# Patient Record
Sex: Male | Born: 1995
Health system: Southern US, Community
[De-identification: ages and names within clinical notes are randomized; demographics above are authoritative.]

## PROBLEM LIST (undated history)

## (undated) DIAGNOSIS — F329 Major depressive disorder, single episode, unspecified: Secondary | ICD-10-CM

## (undated) DIAGNOSIS — F419 Anxiety disorder, unspecified: Secondary | ICD-10-CM

## (undated) DIAGNOSIS — G44229 Chronic tension-type headache, not intractable: Secondary | ICD-10-CM

## (undated) DIAGNOSIS — I10 Essential (primary) hypertension: Secondary | ICD-10-CM

## (undated) DIAGNOSIS — F32A Depression, unspecified: Secondary | ICD-10-CM

## (undated) DIAGNOSIS — K219 Gastro-esophageal reflux disease without esophagitis: Secondary | ICD-10-CM

## (undated) HISTORY — DX: Depression, unspecified: F32.A

## (undated) HISTORY — DX: Anxiety disorder, unspecified: F41.9

## (undated) HISTORY — DX: Essential (primary) hypertension: I10

## (undated) HISTORY — PX: WISDOM TOOTH EXTRACTION: SHX21

## (undated) HISTORY — DX: Chronic tension-type headache, not intractable: G44.229

## (undated) HISTORY — DX: Gastro-esophageal reflux disease without esophagitis: K21.9

---
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
## 2019-06-19 DIAGNOSIS — Z111 Encounter for screening for respiratory tuberculosis: Secondary | ICD-10-CM | POA: Diagnosis not present

## 2019-06-21 DIAGNOSIS — Z111 Encounter for screening for respiratory tuberculosis: Secondary | ICD-10-CM | POA: Diagnosis not present

## 2019-06-28 DIAGNOSIS — Z111 Encounter for screening for respiratory tuberculosis: Secondary | ICD-10-CM | POA: Diagnosis not present

## 2019-07-01 ENCOUNTER — Ambulatory Visit (LOCAL_COMMUNITY_HEALTH_CENTER): Payer: PRIVATE HEALTH INSURANCE

## 2019-07-01 ENCOUNTER — Other Ambulatory Visit: Payer: Self-pay

## 2019-07-01 DIAGNOSIS — Z111 Encounter for screening for respiratory tuberculosis: Secondary | ICD-10-CM

## 2019-07-01 NOTE — Progress Notes (Signed)
Patient had PPD placed at [facility] on 06/28/19. Location right arm. Patient referred to [facility] Dept 07/01/19 for reading PPD Patient has some redness but no induration.  PPD reading negative- 67mm. Aileen Fass, RN

## 2019-10-23 DIAGNOSIS — F411 Generalized anxiety disorder: Secondary | ICD-10-CM | POA: Diagnosis not present

## 2019-10-24 DIAGNOSIS — Z20828 Contact with and (suspected) exposure to other viral communicable diseases: Secondary | ICD-10-CM | POA: Diagnosis not present

## 2019-10-24 DIAGNOSIS — Z03818 Encounter for observation for suspected exposure to other biological agents ruled out: Secondary | ICD-10-CM | POA: Diagnosis not present

## 2019-10-24 DIAGNOSIS — R195 Other fecal abnormalities: Secondary | ICD-10-CM | POA: Diagnosis not present

## 2019-10-24 DIAGNOSIS — R1013 Epigastric pain: Secondary | ICD-10-CM | POA: Diagnosis not present

## 2019-10-24 DIAGNOSIS — R110 Nausea: Secondary | ICD-10-CM | POA: Diagnosis not present

## 2019-10-27 DIAGNOSIS — F411 Generalized anxiety disorder: Secondary | ICD-10-CM | POA: Diagnosis not present

## 2019-10-31 ENCOUNTER — Telehealth: Payer: Self-pay

## 2019-10-31 NOTE — Telephone Encounter (Signed)
Called confirmed appiontment with patient. klh

## 2019-11-03 DIAGNOSIS — F411 Generalized anxiety disorder: Secondary | ICD-10-CM | POA: Diagnosis not present

## 2019-11-04 ENCOUNTER — Other Ambulatory Visit: Payer: Self-pay

## 2019-11-04 ENCOUNTER — Encounter (INDEPENDENT_AMBULATORY_CARE_PROVIDER_SITE_OTHER): Payer: Self-pay

## 2019-11-04 ENCOUNTER — Ambulatory Visit: Payer: BC Managed Care – PPO | Admitting: Adult Health

## 2019-11-04 ENCOUNTER — Encounter: Payer: Self-pay | Admitting: Adult Health

## 2019-11-04 VITALS — BP 141/97 | HR 100 | Resp 16 | Ht 71.0 in | Wt 245.0 lb

## 2019-11-04 DIAGNOSIS — Z6834 Body mass index (BMI) 34.0-34.9, adult: Secondary | ICD-10-CM

## 2019-11-04 DIAGNOSIS — R030 Elevated blood-pressure reading, without diagnosis of hypertension: Secondary | ICD-10-CM | POA: Diagnosis not present

## 2019-11-04 DIAGNOSIS — F419 Anxiety disorder, unspecified: Secondary | ICD-10-CM

## 2019-11-04 DIAGNOSIS — K219 Gastro-esophageal reflux disease without esophagitis: Secondary | ICD-10-CM

## 2019-11-04 NOTE — Progress Notes (Signed)
[facility] [place], [place] [address]  Internal MEDICINE  Office Visit Note  Patient Name: Leroy Duran  [id number]  [id number]  Date of Service: 11/16/2019   Complaints/HPI Pt is here for establishment of PCP. Chief Complaint  Patient presents with  . Medical Management of Chronic Issues    New patient establish care ,   . Gastroesophageal Reflux  . Anxiety    discuss anxiety   . Depression    pt does have a therapist  . Hypertension    been noticing bp has been elevated some    HPI  Pt is here to establish care. He is a well appearing 23 yo male.  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  He reports he has had 3 panic attacks in 6 months. He describes these episodes as Chest pressure, sob, tearful, overwhelming fear, and dread.  He currently sees a counselor weekly for his anxiety.  He currently takes vistaril as needed, and uses prilosec for GERD. He noticed recently his blood pressure has been borderline at home in the 140's/90's.  HE Denies palpitations, headache, or blurred vision.      Current Medication: Outpatient Encounter Medications as of 11/04/2019  Medication Sig  . hydrOXYzine (VISTARIL) 25 MG capsule Take 25 mg by mouth 3 (three) times daily as needed.  Marland Kitchen omeprazole (PRILOSEC) 20 MG capsule Take by mouth.   No facility-administered encounter medications on file as of 11/04/2019.     Surgical History: Past Surgical History:  Procedure Laterality Date  . WISDOM TOOTH EXTRACTION      Medical History: Past Medical History:  Diagnosis Date  . Anxiety   . Chronic tension headaches   . Depression   . GERD (gastroesophageal reflux disease)   . Hypertension     Family History: Family History  Problem Relation Age of Onset  . Hyperlipidemia Mother   . Diabetes Father   . Hypertension Father   . Pulmonary embolism Neg Hx   .  Kidney disease Neg Hx     Social History   Socioeconomic History  . Marital status: Unknown    Spouse name: Not on file  . Number of children: Not on file  . Years of education: Not on file  . Highest education level: Not on file  Occupational History  . Not on file  Social Needs  . Financial resource strain: Not on file  . Food insecurity    Worry: Not on file    Inability: Not on file  . Transportation needs    Medical: Not on file    Non-medical: Not on file  Tobacco Use  . Smoking status: Never Smoker  . Smokeless tobacco: Never Used  Substance and Sexual Activity  . Alcohol use: Never    Frequency: Never  . Drug use: Never  . Sexual activity: Not on file  Lifestyle  . Physical activity    Days per week: Not on file    Minutes per session: Not on file  . Stress: Not on file  Relationships  . Social Herbalist on phone: Not on file    Gets together: Not on file    Attends religious service: Not on file    Active member of club or organization: Not on file    Attends meetings of clubs or organizations: Not on file  Relationship status: Not on file  . Intimate partner violence    Fear of current or ex partner: Not on file    Emotionally abused: Not on file    Physically abused: Not on file    Forced sexual activity: Not on file  Other Topics Concern  . Not on file  Social History Narrative  . Not on file     Review of Systems  Constitutional: Negative.  Negative for chills, fatigue and unexpected weight change.  HENT: Negative.  Negative for congestion, rhinorrhea, sneezing and sore throat.   Eyes: Negative for redness.  Respiratory: Negative.  Negative for cough, chest tightness and shortness of breath.   Cardiovascular: Negative.  Negative for chest pain and palpitations.  Gastrointestinal: Negative.  Negative for abdominal pain, constipation, diarrhea, nausea and vomiting.  Endocrine: Negative.   Genitourinary: Negative.  Negative for  dysuria and frequency.  Musculoskeletal: Negative.  Negative for arthralgias, back pain, joint swelling and neck pain.  Skin: Negative.  Negative for rash.  Allergic/Immunologic: Negative.   Neurological: Negative.  Negative for tremors and numbness.  Hematological: Negative for adenopathy. Does not bruise/bleed easily.  Psychiatric/Behavioral: Negative for behavioral problems, sleep disturbance and suicidal ideas. The patient is nervous/anxious.        Panic attacks, 3 in last 6 months. Getting worse.    Vital Signs: BP (!) 141/97   Pulse 100   Resp 16   Ht 5\' 11"  (1.803 m)   Wt 245 lb (111.1 kg)   SpO2 98%   BMI 34.17 kg/m    Physical Exam Vitals signs and nursing note reviewed.  Constitutional:      General: He is not in acute distress.    Appearance: He is well-developed. He is not diaphoretic.  HENT:     Head: Normocephalic and atraumatic.     Mouth/Throat:     Pharynx: No oropharyngeal exudate.  Eyes:     Pupils: Pupils are equal, round, and reactive to light.  Neck:     Musculoskeletal: Normal range of motion and neck supple.     Thyroid: No thyromegaly.     Vascular: No JVD.     Trachea: No tracheal deviation.  Cardiovascular:     Rate and Rhythm: Normal rate and regular rhythm.     Heart sounds: Normal heart sounds. No murmur. No friction rub. No gallop.   Pulmonary:     Effort: Pulmonary effort is normal. No respiratory distress.     Breath sounds: Normal breath sounds. No wheezing or rales.  Chest:     Chest wall: No tenderness.  Abdominal:     Palpations: Abdomen is soft.     Tenderness: There is no abdominal tenderness. There is no guarding.  Musculoskeletal: Normal range of motion.  Lymphadenopathy:     Cervical: No cervical adenopathy.  Skin:    General: Skin is warm and dry.  Neurological:     Mental Status: He is alert and oriented to person, place, and time.     Cranial Nerves: No cranial nerve deficit.  Psychiatric:        Behavior: Behavior  normal.        Thought Content: Thought content normal.        Judgment: Judgment normal.     Assessment/Plan: 1. Anxiety Pt given lab slip to have physical labs drawn before next visit.  Will schedule physical in coming weeks.  We discussed a daily medication like lexapro would likely be beneficial for him, as his  anxiety is likely to continue to reoccur with the stress of medical school.  He will talk to his wife, and call clinic if he wishes to proceed with this.   2. Elevated BP without diagnosis of hypertension Will follow up in 2 weeks at physical.   3. Gastroesophageal reflux disease without esophagitis Continue prilosec, stable currently.  4. Body mass index (BMI) 34.0-34.9, adult Obesity Counseling: Risk Assessment: An assessment of behavioral risk factors was made today and includes lack of exercise sedentary lifestyle, lack of portion control and poor dietary habits.  Risk Modification Advice: She was counseled on portion control guidelines. Restricting daily caloric intake to. . The detrimental long term effects of obesity on her health and ongoing poor compliance was also discussed with the patient.    General Counseling: Yeudiel verbalizes understanding of the findings of todays visit and agrees with plan of treatment. I have discussed any further diagnostic evaluation that may be needed or ordered today. We also reviewed his medications today. he has been encouraged to call the office with any questions or concerns that should arise related to todays visit.  No orders of the defined types were placed in this encounter.   No orders of the defined types were placed in this encounter.   Time spent: 30 Minutes   This patient was seen by Orson Gear AGNP-C in Collaboration with Dr Lavera Guise as a part of collaborative care agreement  Kendell Bane AGNP-C Internal Medicine

## 2019-11-10 ENCOUNTER — Other Ambulatory Visit: Payer: Self-pay | Admitting: Internal Medicine

## 2019-11-10 ENCOUNTER — Telehealth: Payer: Self-pay

## 2019-11-10 DIAGNOSIS — F411 Generalized anxiety disorder: Secondary | ICD-10-CM | POA: Diagnosis not present

## 2019-11-10 MED ORDER — ESCITALOPRAM OXALATE 10 MG PO TABS: ORAL_TABLET | ORAL | 1 refills | Status: DC

## 2019-11-10 NOTE — Telephone Encounter (Signed)
I sent, pt should be seen in 4 weeks

## 2019-11-10 NOTE — Progress Notes (Signed)
lexapto

## 2019-11-10 NOTE — Telephone Encounter (Signed)
Pt advised we send lexapro 10 mg k take half tab po for 6 days and then increase 1 tab daily with supper and need to been seen in 4 weeks pt already had appt with adam

## 2019-11-13 DIAGNOSIS — Z79899 Other long term (current) drug therapy: Secondary | ICD-10-CM | POA: Diagnosis not present

## 2019-11-13 DIAGNOSIS — R000 Tachycardia, unspecified: Secondary | ICD-10-CM | POA: Diagnosis not present

## 2019-11-13 DIAGNOSIS — R0602 Shortness of breath: Secondary | ICD-10-CM | POA: Diagnosis not present

## 2019-11-13 DIAGNOSIS — R1013 Epigastric pain: Secondary | ICD-10-CM | POA: Diagnosis not present

## 2019-11-13 DIAGNOSIS — R002 Palpitations: Secondary | ICD-10-CM | POA: Diagnosis not present

## 2019-11-13 DIAGNOSIS — F419 Anxiety disorder, unspecified: Secondary | ICD-10-CM | POA: Diagnosis not present

## 2019-11-13 DIAGNOSIS — K219 Gastro-esophageal reflux disease without esophagitis: Secondary | ICD-10-CM | POA: Diagnosis not present

## 2019-11-13 DIAGNOSIS — R079 Chest pain, unspecified: Secondary | ICD-10-CM | POA: Diagnosis not present

## 2019-11-13 DIAGNOSIS — F329 Major depressive disorder, single episode, unspecified: Secondary | ICD-10-CM | POA: Diagnosis not present

## 2019-11-13 DIAGNOSIS — R0789 Other chest pain: Secondary | ICD-10-CM | POA: Diagnosis not present

## 2019-11-17 DIAGNOSIS — F411 Generalized anxiety disorder: Secondary | ICD-10-CM | POA: Diagnosis not present

## 2019-11-19 ENCOUNTER — Encounter: Payer: BC Managed Care – PPO | Admitting: Adult Health

## 2019-11-24 ENCOUNTER — Telehealth: Payer: Self-pay

## 2019-11-24 NOTE — Telephone Encounter (Signed)
Cancelled appointment on 11/26/2019 patient will callback to reschedule stated having insurance issues. klh

## 2019-11-26 ENCOUNTER — Encounter: Payer: BC Managed Care – PPO | Admitting: Adult Health

## 2019-12-17 ENCOUNTER — Other Ambulatory Visit: Payer: Self-pay | Admitting: Adult Health

## 2019-12-17 DIAGNOSIS — D510 Vitamin B12 deficiency anemia due to intrinsic factor deficiency: Secondary | ICD-10-CM | POA: Diagnosis not present

## 2019-12-17 DIAGNOSIS — Z0001 Encounter for general adult medical examination with abnormal findings: Secondary | ICD-10-CM | POA: Diagnosis not present

## 2019-12-17 DIAGNOSIS — E559 Vitamin D deficiency, unspecified: Secondary | ICD-10-CM | POA: Diagnosis not present

## 2019-12-18 ENCOUNTER — Telehealth: Payer: Self-pay

## 2019-12-18 ENCOUNTER — Other Ambulatory Visit: Payer: Self-pay | Admitting: Adult Health

## 2019-12-18 DIAGNOSIS — E559 Vitamin D deficiency, unspecified: Secondary | ICD-10-CM

## 2019-12-18 LAB — CBC WITH DIFFERENTIAL/PLATELET
Basophils Absolute: 0 10*3/uL (ref 0.0–0.2)
Basos: 1 %
EOS (ABSOLUTE): 0.1 10*3/uL (ref 0.0–0.4)
Eos: 1 %
Hematocrit: 48.6 % (ref 37.5–51.0)
Hemoglobin: 16.7 g/dL (ref 13.0–17.7)
Immature Grans (Abs): 0 10*3/uL (ref 0.0–0.1)
Immature Granulocytes: 0 %
Lymphocytes Absolute: 2.4 10*3/uL (ref 0.7–3.1)
Lymphs: 30 %
MCH: 29.8 pg (ref 26.6–33.0)
MCHC: 34.4 g/dL (ref 31.5–35.7)
MCV: 87 fL (ref 79–97)
Monocytes Absolute: 0.6 10*3/uL (ref 0.1–0.9)
Monocytes: 7 %
Neutrophils Absolute: 4.9 10*3/uL (ref 1.4–7.0)
Neutrophils: 61 %
Platelets: 243 10*3/uL (ref 150–450)
RBC: 5.6 x10E6/uL (ref 4.14–5.80)
RDW: 13.1 % (ref 11.6–15.4)
WBC: 8 10*3/uL (ref 3.4–10.8)

## 2019-12-18 LAB — COMPREHENSIVE METABOLIC PANEL
ALT: 27 IU/L (ref 0–44)
AST: 17 IU/L (ref 0–40)
Albumin/Globulin Ratio: 2 (ref 1.2–2.2)
Albumin: 4.5 g/dL (ref 4.1–5.2)
Alkaline Phosphatase: 90 IU/L (ref 39–117)
BUN/Creatinine Ratio: 14 (ref 9–20)
BUN: 16 mg/dL (ref 6–20)
Bilirubin Total: 0.4 mg/dL (ref 0.0–1.2)
CO2: 22 mmol/L (ref 20–29)
Calcium: 9.5 mg/dL (ref 8.7–10.2)
Chloride: 103 mmol/L (ref 96–106)
Creatinine, Ser: 1.17 mg/dL (ref 0.76–1.27)
GFR calc Af Amer: 100 mL/min/{1.73_m2} (ref >59)
GFR calc non Af Amer: 87 mL/min/{1.73_m2} (ref >59)
Globulin, Total: 2.3 g/dL (ref 1.5–4.5)
Glucose: 93 mg/dL (ref 65–99)
Potassium: 4.1 mmol/L (ref 3.5–5.2)
Sodium: 141 mmol/L (ref 134–144)
Total Protein: 6.8 g/dL (ref 6.0–8.5)

## 2019-12-18 LAB — LIPID PANEL WITH LDL/HDL RATIO
Cholesterol, Total: 192 mg/dL (ref 100–199)
HDL: 31 mg/dL — ABNORMAL LOW (ref >39)
LDL Chol Calc (NIH): 126 mg/dL — ABNORMAL HIGH (ref 0–99)
LDL/HDL Ratio: 4.1 ratio — ABNORMAL HIGH (ref 0.0–3.6)
Triglycerides: 196 mg/dL — ABNORMAL HIGH (ref 0–149)
VLDL Cholesterol Cal: 35 mg/dL (ref 5–40)

## 2019-12-18 LAB — B12 AND FOLATE PANEL
Folate: 10.7 ng/mL (ref >3.0)
Vitamin B-12: 467 pg/mL (ref 232–1245)

## 2019-12-18 LAB — VITAMIN D 25 HYDROXY (VIT D DEFICIENCY, FRACTURES): Vit D, 25-Hydroxy: 9.1 ng/mL — ABNORMAL LOW (ref 30.0–100.0)

## 2019-12-18 LAB — IRON AND TIBC
Iron Saturation: 34 % (ref 15–55)
Iron: 99 ug/dL (ref 38–169)
Total Iron Binding Capacity: 288 ug/dL (ref 250–450)
UIBC: 189 ug/dL (ref 111–343)

## 2019-12-18 LAB — T4, FREE: Free T4: 0.97 ng/dL (ref 0.82–1.77)

## 2019-12-18 LAB — FERRITIN: Ferritin: 269 ng/mL (ref 30–400)

## 2019-12-18 LAB — TSH: TSH: 2.4 u[IU]/mL (ref 0.450–4.500)

## 2019-12-18 MED ORDER — VITAMIN D (ERGOCALCIFEROL) 1.25 MG (50000 UNIT) PO CAPS: 50000.0000 [IU] | ORAL_CAPSULE | ORAL | 0 refills | Status: DC

## 2019-12-18 NOTE — Progress Notes (Signed)
Vit d is low, sent RX for drisdol, will follow up with patient as scheduled.

## 2019-12-18 NOTE — Telephone Encounter (Signed)
Confirmed appointment with patient. klh °

## 2019-12-22 ENCOUNTER — Other Ambulatory Visit: Payer: Self-pay

## 2019-12-22 ENCOUNTER — Encounter: Payer: Self-pay | Admitting: Adult Health

## 2019-12-22 ENCOUNTER — Ambulatory Visit: Payer: BC Managed Care – PPO | Admitting: Adult Health

## 2019-12-22 ENCOUNTER — Telehealth: Payer: Self-pay

## 2019-12-22 VITALS — BP 138/78 | HR 95 | Temp 97.8°F | Resp 16 | Ht 71.0 in | Wt 242.0 lb

## 2019-12-22 DIAGNOSIS — Z0001 Encounter for general adult medical examination with abnormal findings: Secondary | ICD-10-CM

## 2019-12-22 DIAGNOSIS — R300 Dysuria: Secondary | ICD-10-CM

## 2019-12-22 DIAGNOSIS — K219 Gastro-esophageal reflux disease without esophagitis: Secondary | ICD-10-CM | POA: Diagnosis not present

## 2019-12-22 DIAGNOSIS — F419 Anxiety disorder, unspecified: Secondary | ICD-10-CM

## 2019-12-22 DIAGNOSIS — D229 Melanocytic nevi, unspecified: Secondary | ICD-10-CM

## 2019-12-22 DIAGNOSIS — E559 Vitamin D deficiency, unspecified: Secondary | ICD-10-CM

## 2019-12-22 DIAGNOSIS — F411 Generalized anxiety disorder: Secondary | ICD-10-CM | POA: Diagnosis not present

## 2019-12-22 DIAGNOSIS — Z6834 Body mass index (BMI) 34.0-34.9, adult: Secondary | ICD-10-CM | POA: Diagnosis not present

## 2019-12-22 MED ORDER — ESCITALOPRAM OXALATE 20 MG PO TABS: ORAL_TABLET | ORAL | 3 refills | Status: DC

## 2019-12-22 NOTE — Telephone Encounter (Signed)
-----   Message from Kendell Bane, NP sent at 12/18/2019  5:39 PM EST ----- Vitamin D level is very low.  Sent RX to take weekly for 8 weeks.  Will see him back as scheduled.  ----- Message ----- From: Lavone Neri Lab Results In Sent: 12/18/2019  11:53 AM EST To: Kendell Bane, NP

## 2019-12-22 NOTE — Telephone Encounter (Signed)
Pt had appt today adam discuss labs with him

## 2019-12-22 NOTE — Progress Notes (Signed)
[facility] [place], [place] [address]  Internal MEDICINE  Office Visit Note  Patient Name: Leroy Duran  [id number]  [id number]  Date of Service: 12/22/2019  Chief Complaint  Patient presents with  . Annual Exam  . Hypertension  . Depression     HPI Pt is here for routine health maintenance examination.  He is a well apearing 24 yo Male here today for physical. He had his labs drawn last week. We discussed his elevated triglycerides level. Overall he has been doing well. His bp is slightly elevated today.  He has been taking his bp at home, and generally gets 120/70's when not in the hospital.   He has been on lexapro for 6 weeks, and feels like he is doing well.  He does have some ongoing anxiousness/depression.  He does feel like the lexapro has helped, but feels like the does could be increased.  When he does have an episode of depression or anxiety he feels like it is less intense.    We did discuss his sleep at this visit.  He denies excessive daytime fatigue, or snoring at night.  He does have trouble staying asleep, and we discussed a sleep study to rule of hypoxia and apnea.  He said he would think about it and let us know at next visit.  Current Medication: Outpatient Encounter Medications as of 12/22/2019  Medication Sig  . escitalopram (LEXAPRO) 20 MG tablet Take one tab PO daily.  . hydrOXYzine (VISTARIL) 25 MG capsule Take 25 mg by mouth 3 (three) times daily as needed.  Marland Kitchen omeprazole (PRILOSEC) 20 MG capsule Take by mouth.  . Vitamin D, Ergocalciferol, (DRISDOL) 1.25 MG (50000 UT) CAPS capsule Take 1 capsule (50,000 Units total) by mouth every 7 (seven) days.  . [DISCONTINUED] escitalopram (LEXAPRO) 10 MG tablet Take half tab po qd with supper for 6 days and then increase it to one tab po qd with supper for GAD/MDD   No facility-administered encounter medications on file as of 12/22/2019.    Surgical History: Past Surgical History:  Procedure  Laterality Date  . WISDOM TOOTH EXTRACTION      Medical History: Past Medical History:  Diagnosis Date  . Anxiety   . Chronic tension headaches   . Depression   . GERD (gastroesophageal reflux disease)   . Hypertension     Family History: Family History  Problem Relation Age of Onset  . Hyperlipidemia Mother   . Diabetes Father   . Hypertension Father   . Pulmonary embolism Neg Hx   . Kidney disease Neg Hx       Review of Systems  Constitutional: Negative.  Negative for chills, fatigue and unexpected weight change.  HENT: Negative.  Negative for congestion, rhinorrhea, sneezing and sore throat.   Eyes: Negative for redness.  Respiratory: Negative.  Negative for cough, chest tightness and shortness of breath.   Cardiovascular: Negative.  Negative for chest pain and palpitations.  Gastrointestinal: Negative.  Negative for abdominal pain, constipation, diarrhea, nausea and vomiting.  Endocrine: Negative.   Genitourinary: Negative.  Negative for dysuria and frequency.  Musculoskeletal: Negative.  Negative for arthralgias, back pain, joint swelling and neck pain.  Skin: Negative.  Negative for rash.  Allergic/Immunologic: Negative.   Neurological: Negative.  Negative for tremors and numbness.  Hematological: Negative for adenopathy. Does not bruise/bleed easily.  Psychiatric/Behavioral: Negative.  Negative for behavioral problems, sleep disturbance and suicidal ideas. The patient is not nervous/anxious.  Vital Signs: BP (!) 144/90   Pulse 95   Temp 97.8 F (36.6 C)   Resp 16   Ht 5\' 11"  (1.803 m)   Wt 242 lb (109.8 kg)   SpO2 98%   BMI 33.75 kg/m    Physical Exam Vitals and nursing note reviewed.  Constitutional:      General: He is not in acute distress.    Appearance: He is well-developed. He is not diaphoretic.  HENT:     Head: Normocephalic and atraumatic.     Mouth/Throat:     Pharynx: No oropharyngeal exudate.  Eyes:     Pupils: Pupils are  equal, round, and reactive to light.  Neck:     Thyroid: No thyromegaly.     Vascular: No JVD.     Trachea: No tracheal deviation.  Cardiovascular:     Rate and Rhythm: Normal rate and regular rhythm.     Heart sounds: Normal heart sounds. No murmur. No friction rub. No gallop.   Pulmonary:     Effort: Pulmonary effort is normal. No respiratory distress.     Breath sounds: Normal breath sounds. No wheezing or rales.  Chest:     Chest wall: No tenderness.  Abdominal:     Palpations: Abdomen is soft.     Tenderness: There is no abdominal tenderness. There is no guarding.  Musculoskeletal:        General: Normal range of motion.     Cervical back: Normal range of motion and neck supple.  Lymphadenopathy:     Cervical: No cervical adenopathy.  Skin:    General: Skin is warm and dry.  Neurological:     Mental Status: He is alert and oriented to person, place, and time.     Cranial Nerves: No cranial nerve deficit.  Psychiatric:        Behavior: Behavior normal.        Thought Content: Thought content normal.        Judgment: Judgment normal.      LABS: Recent Results (from the past 2160 hour(s))  CBC with Differential/Platelet     Status: None   Collection Time: 12/17/19  9:50 AM  Result Value Ref Range   WBC 8.0 3.4 - 10.8 x10E3/uL   RBC 5.60 4.14 - 5.80 x10E6/uL   Hemoglobin 16.7 13.0 - 17.7 g/dL   Hematocrit 48.6 37.5 - 51.0 %   MCV 87 79 - 97 fL   MCH 29.8 26.6 - 33.0 pg   MCHC 34.4 31.5 - 35.7 g/dL   RDW 13.1 11.6 - 15.4 %   Platelets 243 150 - 450 x10E3/uL   Neutrophils 61 Not Estab. %   Lymphs 30 Not Estab. %   Monocytes 7 Not Estab. %   Eos 1 Not Estab. %   Basos 1 Not Estab. %   Neutrophils Absolute 4.9 1.4 - 7.0 x10E3/uL   Lymphocytes Absolute 2.4 0.7 - 3.1 x10E3/uL   Monocytes Absolute 0.6 0.1 - 0.9 x10E3/uL   EOS (ABSOLUTE) 0.1 0.0 - 0.4 x10E3/uL   Basophils Absolute 0.0 0.0 - 0.2 x10E3/uL   Immature Granulocytes 0 Not Estab. %   Immature Grans (Abs)  0.0 0.0 - 0.1 x10E3/uL  Comprehensive metabolic panel     Status: None   Collection Time: 12/17/19  9:50 AM  Result Value Ref Range   Glucose 93 65 - 99 mg/dL   BUN 16 6 - 20 mg/dL   Creatinine, Ser 1.17 0.76 - 1.27 mg/dL  GFR calc non Af Amer 87 >59 mL/min/1.73   GFR calc Af Amer 100 >59 mL/min/1.73   BUN/Creatinine Ratio 14 9 - 20   Sodium 141 134 - 144 mmol/L   Potassium 4.1 3.5 - 5.2 mmol/L   Chloride 103 96 - 106 mmol/L   CO2 22 20 - 29 mmol/L   Calcium 9.5 8.7 - 10.2 mg/dL   Total Protein 6.8 6.0 - 8.5 g/dL   Albumin 4.5 4.1 - 5.2 g/dL   Globulin, Total 2.3 1.5 - 4.5 g/dL   Albumin/Globulin Ratio 2.0 1.2 - 2.2   Bilirubin Total 0.4 0.0 - 1.2 mg/dL   Alkaline Phosphatase 90 39 - 117 IU/L   AST 17 0 - 40 IU/L   ALT 27 0 - 44 IU/L  Lipid Panel With LDL/HDL Ratio     Status: Abnormal   Collection Time: 12/17/19  9:50 AM  Result Value Ref Range   Cholesterol, Total 192 100 - 199 mg/dL   Triglycerides 196 (H) 0 - 149 mg/dL   HDL 31 (L) >39 mg/dL   VLDL Cholesterol Cal 35 5 - 40 mg/dL   LDL Chol Calc (NIH) 126 (H) 0 - 99 mg/dL   LDL/HDL Ratio 4.1 (H) 0.0 - 3.6 ratio    Comment:                                     LDL/HDL Ratio                                             Men  Women                               1/2 Avg.Risk  1.0    1.5                                   Avg.Risk  3.6    3.2                                2X Avg.Risk  6.2    5.0                                3X Avg.Risk  8.0    6.1   Iron and TIBC     Status: None   Collection Time: 12/17/19  9:50 AM  Result Value Ref Range   Total Iron Binding Capacity 288 250 - 450 ug/dL   UIBC 189 111 - 343 ug/dL   Iron 99 38 - 169 ug/dL   Iron Saturation 34 15 - 55 %  B12 and Folate Panel     Status: None   Collection Time: 12/17/19  9:50 AM  Result Value Ref Range   Vitamin B-12 467 232 - 1,245 pg/mL   Folate 10.7 >3.0 ng/mL    Comment: A serum folate concentration of less than 3.1 ng/mL is considered to  represent clinical deficiency.   T4, free     Status: None   Collection Time: 12/17/19  9:50 AM  Result  Value Ref Range   Free T4 0.97 0.82 - 1.77 ng/dL  TSH     Status: None   Collection Time: 12/17/19  9:50 AM  Result Value Ref Range   TSH 2.400 0.450 - 4.500 uIU/mL  VITAMIN D 25 Hydroxy (Vit-D Deficiency, Fractures)     Status: Abnormal   Collection Time: 12/17/19  9:50 AM  Result Value Ref Range   Vit D, 25-Hydroxy 9.1 (L) 30.0 - 100.0 ng/mL    Comment: Vitamin D deficiency has been defined by the [place] practice guideline as a level of serum 25-OH vitamin D less than 20 ng/mL (1,2). The Endocrine Society went on to further define vitamin D insufficiency as a level between 21 and 29 ng/mL (2). 1. IOM (Institute of Medicine). 2010. Dietary reference    intakes for calcium and D. [place]: The    [employer]. 2. Holick MF, Binkley [place], Bischoff-Ferrari HA, et al.    Evaluation, treatment, and prevention of vitamin D    deficiency: an Endocrine Society clinical practice    guideline. JCEM. 2011 Jul; 96(7):1911-30.   Ferritin     Status: None   Collection Time: 12/17/19  9:50 AM  Result Value Ref Range   Ferritin 269 30 - 400 ng/mL    Assessment/Plan: 1. Encounter for general adult medical examination with abnormal findings Up to date on PHM.   2. Anxiety Increase dose to 20mg  daily.  - escitalopram (LEXAPRO) 20 MG tablet; Take one tab PO daily.  Dispense: 30 tablet; Refill: 3  3. Gastroesophageal reflux disease without esophagitis Stable continue prilosec as directed.   4. Body mass index (BMI) 34.0-34.9, adult Obesity Counseling: Risk Assessment: An assessment of behavioral risk factors was made today and includes lack of exercise sedentary lifestyle, lack of portion control and poor dietary habits.  Risk Modification Advice: She was counseled on portion control guidelines. Restricting daily caloric intake to   1800. The detrimental long term effects of obesity on her health and ongoing poor compliance was also discussed with the patient.  5. Dysuria - UA/M w/rflx Culture, Routine  6. Vitamin D deficiency Take Drisodol as directed, follow up as discussed.  Recheck vit D, before next appt.   7. Nevus Referral to derm to establish care.  - Ambulatory referral to Dermatology  General Counseling: vito eberling understanding of the findings of todays visit and agrees with plan of treatment. I have discussed any further diagnostic evaluation that may be needed or ordered today. We also reviewed his medications today. he has been encouraged to call the office with any questions or concerns that should arise related to todays visit.   Orders Placed This Encounter  Procedures  . UA/M w/rflx Culture, Routine  . Vitamin D 1,25 dihydroxy  . Ambulatory referral to Dermatology    Meds ordered this encounter  Medications  . escitalopram (LEXAPRO) 20 MG tablet    Sig: Take one tab PO daily.    Dispense:  30 tablet    Refill:  3    Time spent: 30 Minutes   This patient was seen by Orson Gear AGNP-C in Collaboration with Dr Lavera Guise as a part of collaborative care agreement    Kendell Bane AGNP-C Internal Medicine

## 2019-12-23 LAB — UA/M W/RFLX CULTURE, ROUTINE
Bilirubin, UA: NEGATIVE
Glucose, UA: NEGATIVE
Ketones, UA: NEGATIVE
Leukocytes,UA: NEGATIVE
Nitrite, UA: NEGATIVE
Protein,UA: NEGATIVE
RBC, UA: NEGATIVE
Specific Gravity, UA: 1.023 (ref 1.005–1.030)
Urobilinogen, Ur: 0.2 mg/dL (ref 0.2–1.0)
pH, UA: 5 (ref 5.0–7.5)

## 2019-12-23 LAB — MICROSCOPIC EXAMINATION
Bacteria, UA: NONE SEEN
Casts: NONE SEEN /lpf
Epithelial Cells (non renal): NONE SEEN /hpf (ref 0–10)

## 2020-01-01 ENCOUNTER — Other Ambulatory Visit: Payer: Self-pay | Admitting: Internal Medicine

## 2020-01-05 DIAGNOSIS — F411 Generalized anxiety disorder: Secondary | ICD-10-CM | POA: Diagnosis not present

## 2020-01-19 DIAGNOSIS — F411 Generalized anxiety disorder: Secondary | ICD-10-CM | POA: Diagnosis not present

## 2020-02-02 DIAGNOSIS — D485 Neoplasm of uncertain behavior of skin: Secondary | ICD-10-CM | POA: Diagnosis not present

## 2020-02-02 DIAGNOSIS — D225 Melanocytic nevi of trunk: Secondary | ICD-10-CM | POA: Diagnosis not present

## 2020-02-02 DIAGNOSIS — F411 Generalized anxiety disorder: Secondary | ICD-10-CM | POA: Diagnosis not present

## 2020-02-02 DIAGNOSIS — D2261 Melanocytic nevi of right upper limb, including shoulder: Secondary | ICD-10-CM | POA: Diagnosis not present

## 2020-02-20 ENCOUNTER — Other Ambulatory Visit: Payer: Self-pay

## 2020-02-20 DIAGNOSIS — Z1159 Encounter for screening for other viral diseases: Secondary | ICD-10-CM

## 2020-02-23 ENCOUNTER — Other Ambulatory Visit: Payer: Self-pay

## 2020-02-24 LAB — HCV RNA QUANT: Hepatitis C Quantitation: NOT DETECTED IU/mL

## 2020-02-24 LAB — HEPATITIS B SURFACE ANTIGEN: Hepatitis B Surface Ag: NEGATIVE

## 2020-02-26 ENCOUNTER — Telehealth: Payer: Self-pay

## 2020-02-26 NOTE — Telephone Encounter (Signed)
Confirmed appointment on 03/01/2020 and screened for covid. klh 

## 2020-03-01 ENCOUNTER — Ambulatory Visit: Payer: 59 | Admitting: Adult Health

## 2020-03-01 ENCOUNTER — Encounter: Payer: Self-pay | Admitting: Adult Health

## 2020-03-01 ENCOUNTER — Other Ambulatory Visit: Payer: Self-pay

## 2020-03-01 VITALS — BP 133/89 | HR 78 | Temp 97.4°F | Wt 256.8 lb

## 2020-03-01 DIAGNOSIS — Z111 Encounter for screening for respiratory tuberculosis: Secondary | ICD-10-CM

## 2020-03-01 DIAGNOSIS — K219 Gastro-esophageal reflux disease without esophagitis: Secondary | ICD-10-CM | POA: Diagnosis not present

## 2020-03-01 DIAGNOSIS — Z23 Encounter for immunization: Secondary | ICD-10-CM | POA: Diagnosis not present

## 2020-03-01 DIAGNOSIS — F419 Anxiety disorder, unspecified: Secondary | ICD-10-CM

## 2020-03-01 DIAGNOSIS — Z1159 Encounter for screening for other viral diseases: Secondary | ICD-10-CM

## 2020-03-01 NOTE — Progress Notes (Signed)
[facility] [place], [place] [address]  Internal MEDICINE  Office Visit Note  Patient Name: Leroy Duran  [id number]  [id number]  Date of Service: 03/01/2020  Chief Complaint  Patient presents with  . Follow-up    medical forms for school  . Depression  . Gastroesophageal Reflux  . Hypertension    HPI  Pt is here for follow on anxiety, and GERD.  He is doing very well with lexapro and denies any issues with anxiety/depression.  His gerd is controlled with omeprazole.  He has recently been accepted into medical school, and needs a physical form completed as well as vaccination record.    Current Medication: Outpatient Encounter Medications as of 03/01/2020  Medication Sig  . escitalopram (LEXAPRO) 20 MG tablet Take one tab PO daily.  . hydrOXYzine (VISTARIL) 25 MG capsule Take 25 mg by mouth 3 (three) times daily as needed.  Marland Kitchen omeprazole (PRILOSEC) 20 MG capsule Take by mouth.  . Vitamin D, Ergocalciferol, (DRISDOL) 1.25 MG (50000 UT) CAPS capsule Take 1 capsule (50,000 Units total) by mouth every 7 (seven) days.   No facility-administered encounter medications on file as of 03/01/2020.    Surgical History: Past Surgical History:  Procedure Laterality Date  . WISDOM TOOTH EXTRACTION      Medical History: Past Medical History:  Diagnosis Date  . Anxiety   . Chronic tension headaches   . Depression   . GERD (gastroesophageal reflux disease)   . Hypertension     Family History: Family History  Problem Relation Age of Onset  . Hyperlipidemia Mother   . Diabetes Father   . Hypertension Father   . Pulmonary embolism Neg Hx   . Kidney disease Neg Hx     Social History   Socioeconomic History  . Marital status: Unknown    Spouse name: Not on file  . Number of children: Not on file  . Years of education: Not on file  . Highest education level: Not on file  Occupational History  . Not on file  Tobacco Use  . Smoking status: Never  Smoker  . Smokeless tobacco: Never Used  Substance and Sexual Activity  . Alcohol use: Never  . Drug use: Never  . Sexual activity: Not on file  Other Topics Concern  . Not on file  Social History Narrative  . Not on file   Social Determinants of Health   Financial Resource Strain:   . Difficulty of Paying Living Expenses:   Food Insecurity:   . Worried About Charity fundraiser in the Last Year:   . Arboriculturist in the Last Year:   Transportation Needs:   . Film/video editor (Medical):   Marland Kitchen Lack of Transportation (Non-Medical):   Physical Activity:   . Days of Exercise per Week:   . Minutes of Exercise per Session:   Stress:   . Feeling of Stress :   Social Connections:   . Frequency of Communication with Friends and Family:   . Frequency of Social Gatherings with Friends and Family:   . Attends Religious Services:   . Active Member of Clubs or Organizations:   . Attends Archivist Meetings:   Marland Kitchen Marital Status:   Intimate Partner Violence:   . Fear of Current or Ex-Partner:   . Emotionally Abused:   Marland Kitchen Physically Abused:   . Sexually Abused:       Review of Systems  Constitutional: Negative.  Negative for  chills, fatigue and unexpected weight change.  HENT: Negative.  Negative for congestion, rhinorrhea, sneezing and sore throat.   Eyes: Negative for redness.  Respiratory: Negative.  Negative for cough, chest tightness and shortness of breath.   Cardiovascular: Negative.  Negative for chest pain and palpitations.  Gastrointestinal: Negative.  Negative for abdominal pain, constipation, diarrhea, nausea and vomiting.  Endocrine: Negative.   Genitourinary: Negative.  Negative for dysuria and frequency.  Musculoskeletal: Negative.  Negative for arthralgias, back pain, joint swelling and neck pain.  Skin: Negative.  Negative for rash.  Allergic/Immunologic: Negative.   Neurological: Negative.  Negative for tremors and numbness.  Hematological:  Negative for adenopathy. Does not bruise/bleed easily.  Psychiatric/Behavioral: Negative.  Negative for behavioral problems, sleep disturbance and suicidal ideas. The patient is not nervous/anxious.     Vital Signs: BP 133/89   Pulse 78   Temp (!) 97.4 F (36.3 C)   Wt 256 lb 12.8 oz (116.5 kg)   SpO2 97%   BMI 35.82 kg/m    Physical Exam Vitals and nursing note reviewed.  Constitutional:      General: He is not in acute distress.    Appearance: He is well-developed. He is not diaphoretic.  HENT:     Head: Normocephalic and atraumatic.     Mouth/Throat:     Pharynx: No oropharyngeal exudate.  Eyes:     Pupils: Pupils are equal, round, and reactive to light.  Neck:     Thyroid: No thyromegaly.     Vascular: No JVD.     Trachea: No tracheal deviation.  Cardiovascular:     Rate and Rhythm: Normal rate and regular rhythm.     Heart sounds: Normal heart sounds. No murmur. No friction rub. No gallop.   Pulmonary:     Effort: Pulmonary effort is normal. No respiratory distress.     Breath sounds: Normal breath sounds. No wheezing or rales.  Chest:     Chest wall: No tenderness.  Abdominal:     Palpations: Abdomen is soft.     Tenderness: There is no abdominal tenderness. There is no guarding.  Musculoskeletal:        General: Normal range of motion.     Cervical back: Normal range of motion and neck supple.  Lymphadenopathy:     Cervical: No cervical adenopathy.  Skin:    General: Skin is warm and dry.  Neurological:     Mental Status: He is alert and oriented to person, place, and time.     Cranial Nerves: No cranial nerve deficit.  Psychiatric:        Behavior: Behavior normal.        Thought Content: Thought content normal.        Judgment: Judgment normal.    Assessment/Plan: 1. Anxiety Well controlled with lexapro.  Continue as directed.   2. Gastroesophageal reflux disease without esophagitis Controled, continue omeprazole.  3. Immunization  due Immunizations reviewed, patient is up to date.  Awaiting titers.   4. Need for hepatitis B screening test - Hepatitis B Surface AntiBODY  5. Screening-pulmonary TB - QuantiFERON-TB Gold Plus  General Counseling: Zaelyn verbalizes understanding of the findings of todays visit and agrees with plan of treatment. I have discussed any further diagnostic evaluation that may be needed or ordered today. We also reviewed his medications today. he has been encouraged to call the office with any questions or concerns that should arise related to todays visit.    Orders Placed This Encounter  Procedures  . Hepatitis B Surface AntiBODY  . QuantiFERON-TB Gold Plus    No orders of the defined types were placed in this encounter.   Time spent: 25 Minutes   This patient was seen by Orson Gear AGNP-C in Collaboration with Dr Lavera Guise as a part of collaborative care agreement     Kendell Bane AGNP-C Internal medicine

## 2020-03-05 LAB — QUANTIFERON-TB GOLD PLUS
QuantiFERON Mitogen Value: >10 IU/mL
QuantiFERON Nil Value: 0.04 IU/mL
QuantiFERON TB1 Ag Value: 1 IU/mL
QuantiFERON TB2 Ag Value: 1.24 IU/mL
QuantiFERON-TB Gold Plus: POSITIVE — AB

## 2020-03-05 LAB — HEPATITIS B SURFACE ANTIBODY,QUALITATIVE: Hep B Surface Ab, Qual: NONREACTIVE

## 2020-03-08 ENCOUNTER — Other Ambulatory Visit: Payer: Self-pay

## 2020-03-08 ENCOUNTER — Other Ambulatory Visit: Payer: Self-pay | Admitting: Adult Health

## 2020-03-08 ENCOUNTER — Ambulatory Visit
Admission: RE | Admit: 2020-03-08 | Discharge: 2020-03-08 | Disposition: A | Discharge status: Home / Self Care | Payer: PRIVATE HEALTH INSURANCE | Source: Ambulatory Visit | Attending: Adult Health | Admitting: Adult Health

## 2020-03-08 DIAGNOSIS — R7612 Nonspecific reaction to cell mediated immunity measurement of gamma interferon antigen response without active tuberculosis: Secondary | ICD-10-CM

## 2020-03-08 NOTE — Progress Notes (Signed)
Order for repeat Quantiferon-TB Gold and order for CXR due to positive Quantiferon-TB test.

## 2020-03-10 LAB — QUANTIFERON-TB GOLD PLUS
QuantiFERON Mitogen Value: >10 IU/mL
QuantiFERON Nil Value: 0.03 IU/mL
QuantiFERON TB1 Ag Value: 0.76 IU/mL
QuantiFERON TB2 Ag Value: 2.71 IU/mL
QuantiFERON-TB Gold Plus: POSITIVE — AB

## 2020-03-15 DIAGNOSIS — F411 Generalized anxiety disorder: Secondary | ICD-10-CM | POA: Diagnosis not present

## 2020-03-25 ENCOUNTER — Telehealth: Payer: Self-pay

## 2020-03-25 ENCOUNTER — Other Ambulatory Visit: Payer: Self-pay | Admitting: Family Medicine

## 2020-03-25 ENCOUNTER — Other Ambulatory Visit: Payer: Self-pay

## 2020-03-25 ENCOUNTER — Ambulatory Visit (LOCAL_COMMUNITY_HEALTH_CENTER): Payer: PRIVATE HEALTH INSURANCE

## 2020-03-25 VITALS — Wt 254.0 lb

## 2020-03-25 DIAGNOSIS — R7612 Nonspecific reaction to cell mediated immunity measurement of gamma interferon antigen response without active tuberculosis: Secondary | ICD-10-CM

## 2020-03-25 DIAGNOSIS — Z227 Latent tuberculosis: Secondary | ICD-10-CM

## 2020-03-25 NOTE — Progress Notes (Signed)
Tuberculosis treatment orders  All patients are to be monitored per [place] and county TB policies.   Leroy Duran has latent TB. Treat for latent TB per the following:  Rifampin 600mg  daily by mouth x 4 months, draw LFTs monthly.   Also draw baseline labs including HIV/RPR at LTBI tx start appt on 03/29/20.   +QFT and normal CXR 03/08/20

## 2020-03-25 NOTE — Progress Notes (Signed)
EPI completed via phone. Referred for +QFT. In the past has had questionable TB skin tests.  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 Interested in LTBI tx. Baseline labs to be drawn at Rifampin start appt. Aileen Fass, RN

## 2020-03-25 NOTE — Telephone Encounter (Signed)
Spoke with Leroy Duran from health dept that pt  Labs for tb positive she said they will call pt and schedule him for further eval and treat and also pt advised that health will call him and also his form is ready for pickup

## 2020-03-29 ENCOUNTER — Ambulatory Visit (LOCAL_COMMUNITY_HEALTH_CENTER): Payer: PRIVATE HEALTH INSURANCE

## 2020-03-29 ENCOUNTER — Other Ambulatory Visit: Payer: Self-pay

## 2020-03-29 VITALS — Wt 254.0 lb

## 2020-03-29 DIAGNOSIS — Z23 Encounter for immunization: Secondary | ICD-10-CM

## 2020-03-29 DIAGNOSIS — R7612 Nonspecific reaction to cell mediated immunity measurement of gamma interferon antigen response without active tuberculosis: Secondary | ICD-10-CM

## 2020-03-29 MED ORDER — RIFAMPIN 300 MG PO CAPS: 600.0000 mg | ORAL_CAPSULE | Freq: Every day | ORAL | 0 refills | Status: AC

## 2020-03-29 NOTE — Progress Notes (Signed)
Patient had recent non reactive Hep B titer.  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 Hep B given; tolerated well Aileen Fass, RN

## 2020-03-29 NOTE — Progress Notes (Signed)
Curator for clinical support staff: I agree with the care provided to this patient and was available for any consultation.  I was consulted and agree with the provision of LTBI treatment with rifampin 600mg  daily for 4 months.   Caren Macadam, MD, MPH, ABFM [facility] Medical Director

## 2020-03-30 ENCOUNTER — Telehealth: Payer: Self-pay

## 2020-03-30 ENCOUNTER — Ambulatory Visit (INDEPENDENT_AMBULATORY_CARE_PROVIDER_SITE_OTHER): Payer: PRIVATE HEALTH INSURANCE | Admitting: Dermatology

## 2020-03-30 DIAGNOSIS — D485 Neoplasm of uncertain behavior of skin: Secondary | ICD-10-CM

## 2020-03-30 LAB — CBC WITH DIFFERENTIAL/PLATELET
Basophils Absolute: 0 10*3/uL (ref 0.0–0.2)
Basos: 1 %
EOS (ABSOLUTE): 0.1 10*3/uL (ref 0.0–0.4)
Eos: 1 %
Hematocrit: 52 % — ABNORMAL HIGH (ref 37.5–51.0)
Hemoglobin: 17.8 g/dL — ABNORMAL HIGH (ref 13.0–17.7)
Immature Grans (Abs): 0 10*3/uL (ref 0.0–0.1)
Immature Granulocytes: 0 %
Lymphocytes Absolute: 2.1 10*3/uL (ref 0.7–3.1)
Lymphs: 33 %
MCH: 30 pg (ref 26.6–33.0)
MCHC: 34.2 g/dL (ref 31.5–35.7)
MCV: 88 fL (ref 79–97)
Monocytes Absolute: 0.4 10*3/uL (ref 0.1–0.9)
Monocytes: 6 %
Neutrophils Absolute: 3.7 10*3/uL (ref 1.4–7.0)
Neutrophils: 59 %
Platelets: 227 10*3/uL (ref 150–450)
RBC: 5.94 x10E6/uL — ABNORMAL HIGH (ref 4.14–5.80)
RDW: 12.7 % (ref 11.6–15.4)
WBC: 6.3 10*3/uL (ref 3.4–10.8)

## 2020-03-30 LAB — HEPATIC FUNCTION PANEL
ALT: 20 IU/L (ref 0–44)
AST: 18 IU/L (ref 0–40)
Albumin: 4.6 g/dL (ref 4.1–5.2)
Alkaline Phosphatase: 80 IU/L (ref 39–117)
Bilirubin Total: 0.5 mg/dL (ref 0.0–1.2)
Bilirubin, Direct: 0.12 mg/dL (ref 0.00–0.40)
Total Protein: 6.7 g/dL (ref 6.0–8.5)

## 2020-03-30 MED ORDER — MUPIROCIN 2 % EX OINT: 1.0000 "application " | TOPICAL_OINTMENT | Freq: Every day | CUTANEOUS | 0 refills | Status: AC

## 2020-03-30 NOTE — Telephone Encounter (Signed)
Left message for patient regarding today's surgery.

## 2020-03-30 NOTE — Progress Notes (Signed)
Rifampin 300mg  (#60) dispensed per [facility] order.  Baseline labs drawn today.  Employer letter and TBS form given also. Aileen Fass, RN

## 2020-03-30 NOTE — Telephone Encounter (Signed)
See EPI and Rifampin start appt Aileen Fass, RN

## 2020-03-30 NOTE — Patient Instructions (Signed)

## 2020-03-30 NOTE — Progress Notes (Signed)
   Follow-Up Visit   Subjective  Leroy Duran is a 24 y.o. 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    The following portions of the chart were reviewed this encounter and updated as appropriate: Tobacco  Allergies  Meds  Problems  Med Hx  Surg Hx  Fam Hx      Review of Systems: No other skin or systemic complaints.  Objective  Well appearing patient in no apparent distress; mood and affect are within normal limits.  A focused examination was performed including right upper arm. Relevant physical exam findings are noted in the Assessment and Plan.  Objective  Right ant deltoid - Anterior: 1.5 x 1.1 cm Healing biopsy site  Assessment & Plan  Neoplasm of uncertain behavior of skin Right ant deltoid - Anterior  mupirocin ointment (BACTROBAN) 2 %  Skin excision  Lesion length (cm):  1.5 Lesion width (cm):  1.1 Margin per side (cm):  0.2 Total excision diameter (cm):  1.9 Informed consent: discussed and consent obtained   Timeout: patient name, date of birth, surgical site, and procedure verified   Procedure prep:  Patient was prepped and draped in usual sterile fashion Prep type:  Isopropyl alcohol and povidone-iodine Anesthesia: the lesion was anesthetized in a standard fashion   Anesthetic:  1% lidocaine w/ epinephrine 1-100,000 buffered w/ 8.4% NaHCO3 (13cc) Instrument used: #15 blade   Hemostasis achieved with: pressure   Hemostasis achieved with comment:  Electrocautery Outcome: patient tolerated procedure well with no complications   Post-procedure details: sterile dressing applied and wound care instructions given   Dressing type: bandage and pressure dressing (mupirocin)    Skin repair Complexity:  Complex Final length (cm):  5 Reason for type of repair: reduce tension to allow closure, reduce the risk of dehiscence, infection, and necrosis, reduce subcutaneous dead space and avoid a  hematoma, allow closure of the large defect, preserve normal anatomy, preserve normal anatomical and functional relationships and enhance both functionality and cosmetic results   Undermining comment:  Undermining defect 2 cm Subcutaneous layers (deep stitches):  Suture size:  2-0 Suture type: Vicryl (polyglactin 910)   Subcutaneous suture technique: inverted dermal. Fine/surface layer approximation (top stitches):  Suture size:  3-0 Suture type comment:  Nylon Stitches: simple running   Suture removal (days):  7 Hemostasis achieved with: suture and pressure Outcome: patient tolerated procedure well with no complications   Post-procedure details: sterile dressing applied and wound care instructions given   Dressing type: bandage and pressure dressing (mupirocin)    Specimen 1 - Surgical pathology Differential Diagnosis: Atypical Spindle Cell Nevus Check Margins: Yes 1.5 x 1.1 cm Healing biopsy site [id number]  Return in about 1 week (around 04/06/2020).   I, Ashok Cordia, CMA, am acting as scribe for Sarina Ser, MD . Documentation: I have reviewed the above documentation for accuracy and completeness, and I agree with the above.  Sarina Ser, MD

## 2020-03-31 ENCOUNTER — Encounter: Payer: Self-pay | Admitting: Dermatology

## 2020-04-02 ENCOUNTER — Telehealth: Payer: Self-pay

## 2020-04-02 NOTE — Telephone Encounter (Signed)
Patient informed of pathology results 

## 2020-04-05 ENCOUNTER — Telehealth: Payer: Self-pay

## 2020-04-05 NOTE — Telephone Encounter (Signed)
Confirmed appointment on 04/07/2020. klh

## 2020-04-06 ENCOUNTER — Ambulatory Visit: Payer: PRIVATE HEALTH INSURANCE | Admitting: Dermatology

## 2020-04-07 ENCOUNTER — Other Ambulatory Visit: Payer: Self-pay

## 2020-04-07 ENCOUNTER — Encounter: Payer: Self-pay | Admitting: Adult Health

## 2020-04-07 ENCOUNTER — Ambulatory Visit: Payer: 59 | Admitting: Adult Health

## 2020-04-07 ENCOUNTER — Ambulatory Visit (INDEPENDENT_AMBULATORY_CARE_PROVIDER_SITE_OTHER): Payer: PRIVATE HEALTH INSURANCE | Admitting: Dermatology

## 2020-04-07 VITALS — BP 138/78 | HR 92 | Temp 97.6°F | Resp 16 | Ht 71.0 in | Wt 251.0 lb

## 2020-04-07 DIAGNOSIS — R7612 Nonspecific reaction to cell mediated immunity measurement of gamma interferon antigen response without active tuberculosis: Secondary | ICD-10-CM

## 2020-04-07 DIAGNOSIS — E559 Vitamin D deficiency, unspecified: Secondary | ICD-10-CM

## 2020-04-07 DIAGNOSIS — F411 Generalized anxiety disorder: Secondary | ICD-10-CM

## 2020-04-07 DIAGNOSIS — D225 Melanocytic nevi of trunk: Secondary | ICD-10-CM | POA: Diagnosis not present

## 2020-04-07 DIAGNOSIS — Z4802 Encounter for removal of sutures: Secondary | ICD-10-CM

## 2020-04-07 DIAGNOSIS — Z87898 Personal history of other specified conditions: Secondary | ICD-10-CM

## 2020-04-07 DIAGNOSIS — D239 Other benign neoplasm of skin, unspecified: Secondary | ICD-10-CM

## 2020-04-07 DIAGNOSIS — Z86018 Personal history of other benign neoplasm: Secondary | ICD-10-CM

## 2020-04-07 MED ORDER — HYDROXYZINE PAMOATE 25 MG PO CAPS: 25.0000 mg | ORAL_CAPSULE | Freq: Three times a day (TID) | ORAL | 1 refills | Status: DC | PRN

## 2020-04-07 MED ORDER — VITAMIN D (ERGOCALCIFEROL) 1.25 MG (50000 UNIT) PO CAPS: 50000.0000 [IU] | ORAL_CAPSULE | ORAL | 0 refills | Status: DC

## 2020-04-07 NOTE — Patient Instructions (Signed)
After Suture Removal ° °1. After sutures are removed, the wound should be coated with an antibiotic ointment (eg. Polysporin, Bacitracin) and, if possible, kept covered with a Band-Aid or bandage for an additional 24 hours.  After that, no additional wound care is generally needed.  °2. It is alright to get the area wet. °3. If a skin cancer was removed, your skin should be re-examined in approximately three months. ° ° ° °

## 2020-04-07 NOTE — Progress Notes (Signed)
[facility] [place], [place] [address]  Internal MEDICINE  Office Visit Note  Patient Name: Leroy Duran  [id number]  [id number]  Date of Service: 04/27/2020  Chief Complaint  Patient presents with  . Follow-up    having anxiety  . Depression  . Gastroesophageal Reflux  . Hypertension    HPI  Pt is here for follow up on depression, anxiety, and GERD.  He has been diagnosed with latent TB, and is being treated by the health department. He had an exacerbation of his depression and anxiety a few days after starting the Rifampin.  He contacted the Health department and they told him to stop the meds.  He is feeling somewhat better, and feels like he is headed in the right direction as far as his anxiety is going.      Current Medication: Outpatient Encounter Medications as of 04/07/2020  Medication Sig  . mupirocin ointment (BACTROBAN) 2 % Apply 1 application topically daily. With dressing changes  . omeprazole (PRILOSEC) 20 MG capsule Take by mouth.  . rifampin (RIFADIN) 300 MG capsule Take 2 capsules (600 mg total) by mouth daily.  . [DISCONTINUED] escitalopram (LEXAPRO) 20 MG tablet Take one tab PO daily.  . hydrOXYzine (VISTARIL) 25 MG capsule Take 1 capsule (25 mg total) by mouth 3 (three) times daily as needed.  . Vitamin D, Ergocalciferol, (DRISDOL) 1.25 MG (50000 UNIT) CAPS capsule Take 1 capsule (50,000 Units total) by mouth every 7 (seven) days.  . [DISCONTINUED] Vitamin D, Ergocalciferol, (DRISDOL) 1.25 MG (50000 UT) CAPS capsule Take 1 capsule (50,000 Units total) by mouth every 7 (seven) days.   No facility-administered encounter medications on file as of 04/07/2020.    Surgical History: Past Surgical History:  Procedure Laterality Date  . WISDOM TOOTH EXTRACTION      Medical History: Past Medical History:  Diagnosis Date  . Anxiety   . Chronic tension headaches   . Depression   . GERD (gastroesophageal reflux disease)   . Hypertension      Family History: Family History  Problem Relation Age of Onset  . Hyperlipidemia Mother   . Diabetes Father   . Hypertension Father   . Pulmonary embolism Neg Hx   . Kidney disease Neg Hx     Social History   Socioeconomic History  . Marital status: Unknown    Spouse name: Not on file  . Number of children: Not on file  . Years of education: Not on file  . Highest education level: Not on file  Occupational History  . Not on file  Tobacco Use  . Smoking status: Never Smoker  . Smokeless tobacco: Never Used  Substance and Sexual Activity  . Alcohol use: Never  . Drug use: Never  . Sexual activity: Yes  Other Topics Concern  . Not on file  Social History Narrative  . Not on file   Social Determinants of Health   Financial Resource Strain:   . Difficulty of Paying Living Expenses:   Food Insecurity:   . Worried About Charity fundraiser in the Last Year:   . Arboriculturist in the Last Year:   Transportation Needs:   . Film/video editor (Medical):   Marland Kitchen Lack of Transportation (Non-Medical):   Physical Activity:   . Days of Exercise per Week:   . Minutes of Exercise per Session:   Stress:   . Feeling of Stress :   Social Connections:   . Frequency  of Communication with Friends and Family:   . Frequency of Social Gatherings with Friends and Family:   . Attends Religious Services:   . Active Member of Clubs or Organizations:   . Attends Archivist Meetings:   Marland Kitchen Marital Status:   Intimate Partner Violence:   . Fear of Current or Ex-Partner:   . Emotionally Abused:   Marland Kitchen Physically Abused:   . Sexually Abused:       Review of Systems  Constitutional: Negative.  Negative for chills, fatigue and unexpected weight change.  HENT: Negative.  Negative for congestion, rhinorrhea, sneezing and sore throat.   Eyes: Negative for redness.  Respiratory: Negative.  Negative for cough, chest tightness and shortness of breath.   Cardiovascular: Negative.   Negative for chest pain and palpitations.  Gastrointestinal: Negative.  Negative for abdominal pain, constipation, diarrhea, nausea and vomiting.  Endocrine: Negative.   Genitourinary: Negative.  Negative for dysuria and frequency.  Musculoskeletal: Negative.  Negative for arthralgias, back pain, joint swelling and neck pain.  Skin: Negative.  Negative for rash.  Allergic/Immunologic: Negative.   Neurological: Negative.  Negative for tremors and numbness.  Hematological: Negative for adenopathy. Does not bruise/bleed easily.  Psychiatric/Behavioral: Negative.  Negative for behavioral problems, sleep disturbance and suicidal ideas. The patient is not nervous/anxious.     Vital Signs: BP 138/78   Pulse 92   Temp 97.6 F (36.4 C)   Resp 16   Ht 5\' 11"  (1.803 m)   Wt 251 lb (113.9 kg)   SpO2 98%   BMI 35.01 kg/m    Physical Exam Vitals and nursing note reviewed.  Constitutional:      General: He is not in acute distress.    Appearance: He is well-developed. He is not diaphoretic.  HENT:     Head: Normocephalic and atraumatic.     Mouth/Throat:     Pharynx: No oropharyngeal exudate.  Eyes:     Pupils: Pupils are equal, round, and reactive to light.  Neck:     Thyroid: No thyromegaly.     Vascular: No JVD.     Trachea: No tracheal deviation.  Cardiovascular:     Rate and Rhythm: Normal rate and regular rhythm.     Heart sounds: Normal heart sounds. No murmur. No friction rub. No gallop.   Pulmonary:     Effort: Pulmonary effort is normal. No respiratory distress.     Breath sounds: Normal breath sounds. No wheezing or rales.  Chest:     Chest wall: No tenderness.  Abdominal:     Palpations: Abdomen is soft.     Tenderness: There is no abdominal tenderness. There is no guarding.  Musculoskeletal:        General: Normal range of motion.     Cervical back: Normal range of motion and neck supple.  Lymphadenopathy:     Cervical: No cervical adenopathy.  Skin:    General:  Skin is warm and dry.  Neurological:     Mental Status: He is alert and oriented to person, place, and time.     Cranial Nerves: No cranial nerve deficit.  Psychiatric:        Behavior: Behavior normal.        Thought Content: Thought content normal.        Judgment: Judgment normal.    Assessment/Plan: 1. Vitamin D deficiency Start Vit D replacement as discussed.  - Vitamin D, Ergocalciferol, (DRISDOL) 1.25 MG (50000 UNIT) CAPS capsule; Take 1 capsule (50,000  Units total) by mouth every 7 (seven) days.  Dispense: 8 capsule; Refill: 0  2. GAD (generalized anxiety disorder) Refilled Vistaril - hydrOXYzine (VISTARIL) 25 MG capsule; Take 1 capsule (25 mg total) by mouth 3 (three) times daily as needed.  Dispense: 90 capsule; Refill: 1  3. Positive QuantiFERON-TB Gold test Continue to follow with health department for treatment and surveillance.   General Counseling: Timathy verbalizes understanding of the findings of todays visit and agrees with plan of treatment. I have discussed any further diagnostic evaluation that may be needed or ordered today. We also reviewed his medications today. he has been encouraged to call the office with any questions or concerns that should arise related to todays visit.    No orders of the defined types were placed in this encounter.   Meds ordered this encounter  Medications  . hydrOXYzine (VISTARIL) 25 MG capsule    Sig: Take 1 capsule (25 mg total) by mouth 3 (three) times daily as needed.    Dispense:  90 capsule    Refill:  1  . Vitamin D, Ergocalciferol, (DRISDOL) 1.25 MG (50000 UNIT) CAPS capsule    Sig: Take 1 capsule (50,000 Units total) by mouth every 7 (seven) days.    Dispense:  8 capsule    Refill:  0    Time spent: 30 Minutes   This patient was seen by Orson Gear AGNP-C in Collaboration with Dr Lavera Guise as a part of collaborative care agreement     Kendell Bane AGNP-C Internal medicine

## 2020-04-07 NOTE — Progress Notes (Signed)
   Follow-Up Visit   Subjective  Leroy Duran is a 24 y.o. male who presents for the following: repigmented dysplastic nevus (patient has noticed some repigmentation at the LUQA lat epigastric site) and post op./suture removal (R ant deltoid - suture removal today ).  The following portions of the chart were reviewed this encounter and updated as appropriate:  Tobacco  Allergies  Meds  Problems  Med Hx  Surg Hx  Fam Hx     Review of Systems:  No other skin or systemic complaints except as noted in HPI or Assessment and Plan.  Objective  Well appearing patient in no apparent distress; mood and affect are within normal limits.  A focused examination was performed including the trunk and extremities. Relevant physical exam findings are noted in the Assessment and Plan.  Objective  LUQA lat epigastric: Repigmented brown macule   Objective  R ant deltoid: Healing excision site    Assessment & Plan  Dysplastic nevus LUQA lat epigastric  Epidermal / dermal shaving - LUQA lat epigastric  Lesion length (cm):  0.6 Lesion width (cm):  0.3 Margin per side (cm):  0.2 Total excision diameter (cm):  1 Informed consent: discussed and consent obtained   Timeout: patient name, date of birth, surgical site, and procedure verified   Procedure prep:  Patient was prepped and draped in usual sterile fashion Prep type:  Isopropyl alcohol Anesthesia: the lesion was anesthetized in a standard fashion   Anesthetic:  1% lidocaine w/ epinephrine 1-100,000 buffered w/ 8.4% NaHCO3 Instrument used: flexible razor blade   Hemostasis achieved with: pressure, aluminum chloride and electrodesiccation   Outcome: patient tolerated procedure well   Post-procedure details: sterile dressing applied and wound care instructions given   Dressing type: bandage and petrolatum    History of atypical nevus R ant deltoid  Encounter for Removal of Sutures - Incision site at the R ant deltoid is clean, dry and  intact - Wound cleansed, sutures removed, wound cleansed and steri strips applied.  - Discussed pathology results showing margins free - Patient advised to keep steri-strips dry until they fall off. - Scars remodel for a full year. - Once steri-strips fall off, patient can apply over-the-counter silicone scar cream each night to help with scar remodeling if desired. - Patient advised to call with any concerns or if they notice any new or changing lesions.   Return in about 1 year (around 04/07/2021) for TBSE - patient moving to [facility] and will f/u there.  Luther Redo, CMA, am acting as scribe for Sarina Ser, MD .   Documentation: I have reviewed the above documentation for accuracy and completeness, and I agree with the above.  Sarina Ser, MD

## 2020-04-09 ENCOUNTER — Telehealth: Payer: Self-pay

## 2020-04-09 NOTE — Telephone Encounter (Signed)
Patient referred to Health Department after receiving positive TB test and he will be treated with Rifarpin 64month course. [phone]

## 2020-04-10 ENCOUNTER — Encounter: Payer: Self-pay | Admitting: Dermatology

## 2020-04-19 ENCOUNTER — Telehealth: Payer: Self-pay

## 2020-04-19 NOTE — Telephone Encounter (Signed)
TC from patient.  Reports increased anxiety since starting the Rifampin.  Wishes to defer LTBI tx at this time.  Will f/u with TB RN if he wants to continue treatment Aileen Fass, RN

## 2020-04-19 NOTE — Telephone Encounter (Signed)
Called lmom informing patient of appointment on 04/21/2020. klh

## 2020-04-20 ENCOUNTER — Ambulatory Visit: Payer: BC Managed Care – PPO | Admitting: Adult Health

## 2020-04-21 ENCOUNTER — Ambulatory Visit: Payer: 59 | Admitting: Adult Health

## 2020-04-23 ENCOUNTER — Other Ambulatory Visit: Payer: Self-pay | Admitting: Adult Health

## 2020-04-23 DIAGNOSIS — F419 Anxiety disorder, unspecified: Secondary | ICD-10-CM

## 2020-05-17 ENCOUNTER — Other Ambulatory Visit: Payer: Self-pay

## 2020-05-17 ENCOUNTER — Ambulatory Visit: Payer: 59 | Admitting: Adult Health

## 2020-05-17 ENCOUNTER — Encounter: Payer: Self-pay | Admitting: Adult Health

## 2020-05-17 VITALS — BP 160/88 | HR 96 | Temp 97.4°F | Resp 16 | Ht 71.0 in | Wt 260.0 lb

## 2020-05-17 DIAGNOSIS — K219 Gastro-esophageal reflux disease without esophagitis: Secondary | ICD-10-CM

## 2020-05-17 DIAGNOSIS — E559 Vitamin D deficiency, unspecified: Secondary | ICD-10-CM | POA: Diagnosis not present

## 2020-05-17 DIAGNOSIS — R030 Elevated blood-pressure reading, without diagnosis of hypertension: Secondary | ICD-10-CM

## 2020-05-17 DIAGNOSIS — F411 Generalized anxiety disorder: Secondary | ICD-10-CM

## 2020-05-17 MED ORDER — OMEPRAZOLE 20 MG PO CPDR: 20.0000 mg | DELAYED_RELEASE_CAPSULE | Freq: Every day | ORAL | 1 refills | Status: DC

## 2020-05-17 NOTE — Progress Notes (Signed)
[facility] [place], [place] [address]  Internal MEDICINE  Office Visit Note  Patient Name: Leroy Duran  [id number]  [id number]  Date of Service: 05/17/2020  Chief Complaint  Patient presents with  . Follow-up  . Depression  . Gastroesophageal Reflux  . Hypertension    HPI Pt is seen today for follow up on anxiety, depression and gerd. He reports his anxiety has been decently controlled.  He has taken one hydroxyzine since our last visit.  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  His blood pressure is slightly elevated 160/88. He has been talking with the health department about his Latent TB and remain in communication with them.      Current Medication: Outpatient Encounter Medications as of 05/17/2020  Medication Sig  . escitalopram (LEXAPRO) 20 MG tablet TAKE 1 TABLET BY MOUTH EVERY DAY  . hydrOXYzine (VISTARIL) 25 MG capsule Take 1 capsule (25 mg total) by mouth 3 (three) times daily as needed.  . mupirocin ointment (BACTROBAN) 2 % Apply 1 application topically daily. With dressing changes  . omeprazole (PRILOSEC) 20 MG capsule Take 1 capsule (20 mg total) by mouth daily.  . Vitamin D, Ergocalciferol, (DRISDOL) 1.25 MG (50000 UNIT) CAPS capsule Take 1 capsule (50,000 Units total) by mouth every 7 (seven) days.  . [DISCONTINUED] omeprazole (PRILOSEC) 20 MG capsule Take by mouth.   No facility-administered encounter medications on file as of 05/17/2020.    Surgical History: Past Surgical History:  Procedure Laterality Date  . WISDOM TOOTH EXTRACTION      Medical History: Past Medical History:  Diagnosis Date  . Anxiety   . Chronic tension headaches   . Depression   . GERD (gastroesophageal reflux disease)   . Hypertension     Family History: Family History  Problem Relation Age of Onset  . Hyperlipidemia Mother   . Diabetes Father   . Hypertension Father   . Pulmonary embolism Neg Hx   .  Kidney disease Neg Hx     Social History   Socioeconomic History  . Marital status: Unknown    Spouse name: Not on file  . Number of children: Not on file  . Years of education: Not on file  . Highest education level: Not on file  Occupational History  . Not on file  Tobacco Use  . Smoking status: Never Smoker  . Smokeless tobacco: Never Used  Substance and Sexual Activity  . Alcohol use: Never  . Drug use: Never  . Sexual activity: Yes  Other Topics Concern  . Not on file  Social History Narrative  . Not on file   Social Determinants of Health   Financial Resource Strain:   . Difficulty of Paying Living Expenses:   Food Insecurity:   . Worried About Charity fundraiser in the Last Year:   . Arboriculturist in the Last Year:   Transportation Needs:   . Film/video editor (Medical):   Marland Kitchen Lack of Transportation (Non-Medical):   Physical Activity:   . Days of Exercise per Week:   . Minutes of Exercise per Session:   Stress:   . Feeling of Stress :   Social Connections:   . Frequency of Communication with Friends and Family:   . Frequency of Social Gatherings with Friends and Family:   . Attends Religious Services:   . Active Member of Clubs or Organizations:   .  Attends Archivist Meetings:   Marland Kitchen Marital Status:   Intimate Partner Violence:   . Fear of Current or Ex-Partner:   . Emotionally Abused:   Marland Kitchen Physically Abused:   . Sexually Abused:       Review of Systems  Constitutional: Negative.  Negative for chills, fatigue and unexpected weight change.  HENT: Negative.  Negative for congestion, rhinorrhea, sneezing and sore throat.   Eyes: Negative for redness.  Respiratory: Negative.  Negative for cough, chest tightness and shortness of breath.   Cardiovascular: Negative.  Negative for chest pain and palpitations.  Gastrointestinal: Negative.  Negative for abdominal pain, constipation, diarrhea, nausea and vomiting.  Endocrine: Negative.    Genitourinary: Negative.  Negative for dysuria and frequency.  Musculoskeletal: Negative.  Negative for arthralgias, back pain, joint swelling and neck pain.  Skin: Negative.  Negative for rash.  Allergic/Immunologic: Negative.   Neurological: Negative.  Negative for tremors and numbness.  Hematological: Negative for adenopathy. Does not bruise/bleed easily.  Psychiatric/Behavioral: Negative.  Negative for behavioral problems, sleep disturbance and suicidal ideas. The patient is not nervous/anxious.     Vital Signs: BP (!) 160/88   Pulse 96   Temp (!) 97.4 F (36.3 C)   Resp 16   Ht 5\' 11"  (1.803 m)   Wt 260 lb (117.9 kg)   SpO2 98%   BMI 36.26 kg/m    Physical Exam Vitals and nursing note reviewed.  Constitutional:      General: He is not in acute distress.    Appearance: He is well-developed. He is not diaphoretic.  HENT:     Head: Normocephalic and atraumatic.     Mouth/Throat:     Pharynx: No oropharyngeal exudate.  Eyes:     Pupils: Pupils are equal, round, and reactive to light.  Neck:     Thyroid: No thyromegaly.     Vascular: No JVD.     Trachea: No tracheal deviation.  Cardiovascular:     Rate and Rhythm: Normal rate and regular rhythm.     Heart sounds: Normal heart sounds. No murmur. No friction rub. No gallop.   Pulmonary:     Effort: Pulmonary effort is normal. No respiratory distress.     Breath sounds: Normal breath sounds. No wheezing or rales.  Chest:     Chest wall: No tenderness.  Abdominal:     Palpations: Abdomen is soft.     Tenderness: There is no abdominal tenderness. There is no guarding.  Musculoskeletal:        General: Normal range of motion.     Cervical back: Normal range of motion and neck supple.  Lymphadenopathy:     Cervical: No cervical adenopathy.  Skin:    General: Skin is warm and dry.  Neurological:     Mental Status: He is alert and oriented to person, place, and time.     Cranial Nerves: No cranial nerve deficit.   Psychiatric:        Behavior: Behavior normal.        Thought Content: Thought content normal.        Judgment: Judgment normal.    Assessment/Plan: 1. GAD (generalized anxiety disorder) Discussed adding Buspar or wellbutrin to his lexapro for added control of symptoms.  He will call and let us know if he feels like he needs to take an additional medication.   2. Gastroesophageal reflux disease without esophagitis Continue to use prilosec as discussed.   3. Vitamin D deficiency Continue Vit  D replacement as ordered.   4. Elevated BP without diagnosis of hypertension Likely due to increased stress.  Continue to monitor.   General Counseling: Danna verbalizes understanding of the findings of todays visit and agrees with plan of treatment. I have discussed any further diagnostic evaluation that may be needed or ordered today. We also reviewed his medications today. he has been encouraged to call the office with any questions or concerns that should arise related to todays visit.    No orders of the defined types were placed in this encounter.   Meds ordered this encounter  Medications  . omeprazole (PRILOSEC) 20 MG capsule    Sig: Take 1 capsule (20 mg total) by mouth daily.    Dispense:  90 capsule    Refill:  1    Will pick up refill July 1st, 2021    Time spent: 30 Minutes   This patient was seen by Orson Gear AGNP-C in Collaboration with Dr Lavera Guise as a part of collaborative care agreement     Kendell Bane AGNP-C Internal medicine

## 2020-05-26 ENCOUNTER — Other Ambulatory Visit: Payer: Self-pay

## 2020-05-26 DIAGNOSIS — E559 Vitamin D deficiency, unspecified: Secondary | ICD-10-CM

## 2020-05-26 MED ORDER — VITAMIN D (ERGOCALCIFEROL) 1.25 MG (50000 UNIT) PO CAPS: 50000.0000 [IU] | ORAL_CAPSULE | ORAL | 0 refills | Status: AC

## 2020-06-10 ENCOUNTER — Other Ambulatory Visit: Payer: Self-pay | Admitting: Adult Health

## 2020-06-10 DIAGNOSIS — F411 Generalized anxiety disorder: Secondary | ICD-10-CM

## 2020-06-11 ENCOUNTER — Telehealth: Payer: Self-pay

## 2020-06-11 NOTE — Telephone Encounter (Signed)
Confirmed appointment on 06/16/2020 and screened for covid. klh 

## 2020-06-16 ENCOUNTER — Telehealth: Payer: Self-pay

## 2020-06-16 ENCOUNTER — Ambulatory Visit: Payer: 59 | Admitting: Adult Health

## 2020-06-16 ENCOUNTER — Encounter: Payer: Self-pay | Admitting: Adult Health

## 2020-06-16 ENCOUNTER — Other Ambulatory Visit: Payer: Self-pay

## 2020-06-16 VITALS — BP 136/86 | HR 80 | Temp 97.5°F | Resp 16 | Ht 71.0 in | Wt 263.2 lb

## 2020-06-16 DIAGNOSIS — F419 Anxiety disorder, unspecified: Secondary | ICD-10-CM

## 2020-06-16 DIAGNOSIS — K219 Gastro-esophageal reflux disease without esophagitis: Secondary | ICD-10-CM

## 2020-06-16 DIAGNOSIS — F411 Generalized anxiety disorder: Secondary | ICD-10-CM

## 2020-06-16 MED ORDER — ESCITALOPRAM OXALATE 20 MG PO TABS: ORAL_TABLET | ORAL | 0 refills | Status: DC

## 2020-06-16 NOTE — Telephone Encounter (Signed)
Patient is moving cancelled appointment on 12/22/2020. klh

## 2020-06-16 NOTE — Progress Notes (Signed)
[facility] [place], [place] [address]  Internal MEDICINE  Office Visit Note  Patient Name: Leroy Duran  [id number]  [id number]  Date of Service: 06/22/2020  Chief Complaint  Patient presents with  . Follow-up  . Depression  . Gastroesophageal Reflux  . Hypertension    HPI  Pt is here for follow up on depression, GERD and HTN. He reports he is doing much better since out last visit.  He feels like his anxiety is back to baseline.  He has not needed his hydroxyzine since our last visit. 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     Current Medication: Outpatient Encounter Medications as of 06/16/2020  Medication Sig  . escitalopram (LEXAPRO) 20 MG tablet TAKE 1 TABLET BY MOUTH EVERY DAY  . hydrOXYzine (VISTARIL) 25 MG capsule TAKE 1 CAPSULE (25 MG TOTAL) BY MOUTH 3 (THREE) TIMES DAILY AS NEEDED.  . mupirocin ointment (BACTROBAN) 2 % Apply 1 application topically daily. With dressing changes  . omeprazole (PRILOSEC) 20 MG capsule Take 1 capsule (20 mg total) by mouth daily.  . Vitamin D, Ergocalciferol, (DRISDOL) 1.25 MG (50000 UNIT) CAPS capsule Take 1 capsule (50,000 Units total) by mouth every 7 (seven) days.  . [DISCONTINUED] escitalopram (LEXAPRO) 20 MG tablet TAKE 1 TABLET BY MOUTH EVERY DAY   No facility-administered encounter medications on file as of 06/16/2020.    Surgical History: Past Surgical History:  Procedure Laterality Date  . WISDOM TOOTH EXTRACTION      Medical History: Past Medical History:  Diagnosis Date  . Anxiety   . Chronic tension headaches   . Depression   . GERD (gastroesophageal reflux disease)   . Hypertension     Family History: Family History  Problem Relation Age of Onset  . Hyperlipidemia Mother   . Diabetes Father   . Hypertension Father   . Pulmonary embolism Neg Hx   . Kidney disease Neg Hx     Social  History   Socioeconomic History  . Marital status: Unknown    Spouse name: Not on file  . Number of children: Not on file  . Years of education: Not on file  . Highest education level: Not on file  Occupational History  . Not on file  Tobacco Use  . Smoking status: Never Smoker  . Smokeless tobacco: Never Used  Vaping Use  . Vaping Use: Never used  Substance and Sexual Activity  . Alcohol use: Never  . Drug use: Never  . Sexual activity: Yes  Other Topics Concern  . Not on file  Social History Narrative  . Not on file   Social Determinants of Health   Financial Resource Strain:   . Difficulty of Paying Living Expenses:   Food Insecurity:   . Worried About Charity fundraiser in the Last Year:   . Arboriculturist in the Last Year:   Transportation Needs:   . Film/video editor (Medical):   Marland Kitchen Lack of Transportation (Non-Medical):   Physical Activity:   . Days of Exercise per Week:   . Minutes of Exercise per Session:   Stress:   . Feeling of Stress :   Social Connections:   . Frequency of Communication with Friends and Family:   . Frequency of Social Gatherings with Friends and Family:   . Attends Religious Services:   .  Active Member of Clubs or Organizations:   . Attends Archivist Meetings:   Marland Kitchen Marital Status:   Intimate Partner Violence:   . Fear of Current or Ex-Partner:   . Emotionally Abused:   Marland Kitchen Physically Abused:   . Sexually Abused:       Review of Systems  Constitutional: Negative.  Negative for chills, fatigue and unexpected weight change.  HENT: Negative.  Negative for congestion, rhinorrhea, sneezing and sore throat.   Eyes: Negative for redness.  Respiratory: Negative.  Negative for cough, chest tightness and shortness of breath.   Cardiovascular: Negative.  Negative for chest pain and palpitations.  Gastrointestinal: Negative.  Negative for abdominal pain, constipation, diarrhea, nausea and vomiting.  Endocrine: Negative.    Genitourinary: Negative.  Negative for dysuria and frequency.  Musculoskeletal: Negative.  Negative for arthralgias, back pain, joint swelling and neck pain.  Skin: Negative.  Negative for rash.  Allergic/Immunologic: Negative.   Neurological: Negative.  Negative for tremors and numbness.  Hematological: Negative for adenopathy. Does not bruise/bleed easily.  Psychiatric/Behavioral: Negative.  Negative for behavioral problems, sleep disturbance and suicidal ideas. The patient is not nervous/anxious.     Vital Signs: BP 136/86   Pulse 80   Temp (!) 97.5 F (36.4 C)   Resp 16   Ht 5\' 11"  (1.803 m)   Wt 263 lb 3.2 oz (119.4 kg)   SpO2 99%   BMI 36.71 kg/m    Physical Exam Vitals and nursing note reviewed.  Constitutional:      General: He is not in acute distress.    Appearance: He is well-developed. He is not diaphoretic.  HENT:     Head: Normocephalic and atraumatic.     Mouth/Throat:     Pharynx: No oropharyngeal exudate.  Eyes:     Pupils: Pupils are equal, round, and reactive to light.  Neck:     Thyroid: No thyromegaly.     Vascular: No JVD.     Trachea: No tracheal deviation.  Cardiovascular:     Rate and Rhythm: Normal rate and regular rhythm.     Heart sounds: Normal heart sounds. No murmur heard.  No friction rub. No gallop.   Pulmonary:     Effort: Pulmonary effort is normal. No respiratory distress.     Breath sounds: Normal breath sounds. No wheezing or rales.  Chest:     Chest wall: No tenderness.  Abdominal:     Palpations: Abdomen is soft.     Tenderness: There is no abdominal tenderness. There is no guarding.  Musculoskeletal:        General: Normal range of motion.     Cervical back: Normal range of motion and neck supple.  Lymphadenopathy:     Cervical: No cervical adenopathy.  Skin:    General: Skin is warm and dry.  Neurological:     Mental Status: He is alert and oriented to person, place, and time.     Cranial Nerves: No cranial nerve  deficit.  Psychiatric:        Behavior: Behavior normal.        Thought Content: Thought content normal.        Judgment: Judgment normal.    Assessment/Plan: 1. Anxiety Refilled patients lexapro at this time. Continue to use as prescribed.  - escitalopram (LEXAPRO) 20 MG tablet; TAKE 1 TABLET BY MOUTH EVERY DAY  Dispense: 90 tablet; Refill: 0  2. GAD (generalized anxiety disorder) Improved at this time.  Continue to follow.  3. Gastroesophageal reflux disease without esophagitis Stable, continue current management.   General Counseling: Breckin verbalizes understanding of the findings of todays visit and agrees with plan of treatment. I have discussed any further diagnostic evaluation that may be needed or ordered today. We also reviewed his medications today. he has been encouraged to call the office with any questions or concerns that should arise related to todays visit.    No orders of the defined types were placed in this encounter.   Meds ordered this encounter  Medications  . escitalopram (LEXAPRO) 20 MG tablet    Sig: TAKE 1 TABLET BY MOUTH EVERY DAY    Dispense:  90 tablet    Refill:  0    Patient is moving to [facility], please provide 90 day supply to last until he finds new pcp.    Time spent: 30 Minutes   This patient was seen by Orson Gear AGNP-C in Collaboration with Dr Lavera Guise as a part of collaborative care agreement     Kendell Bane AGNP-C Internal medicine

## 2020-06-18 IMAGING — CR DG CHEST 2V
1 series · 2 of 2 positions shown · non-contrast
Comparison: None.

CLINICAL DATA: Positive QuantiFERON test.

EXAM:
CHEST - 2 VIEW

[Series 1: dg chest 2 view · 0.14mm/px · 2 of 2 slices shown]
[im 1/2]
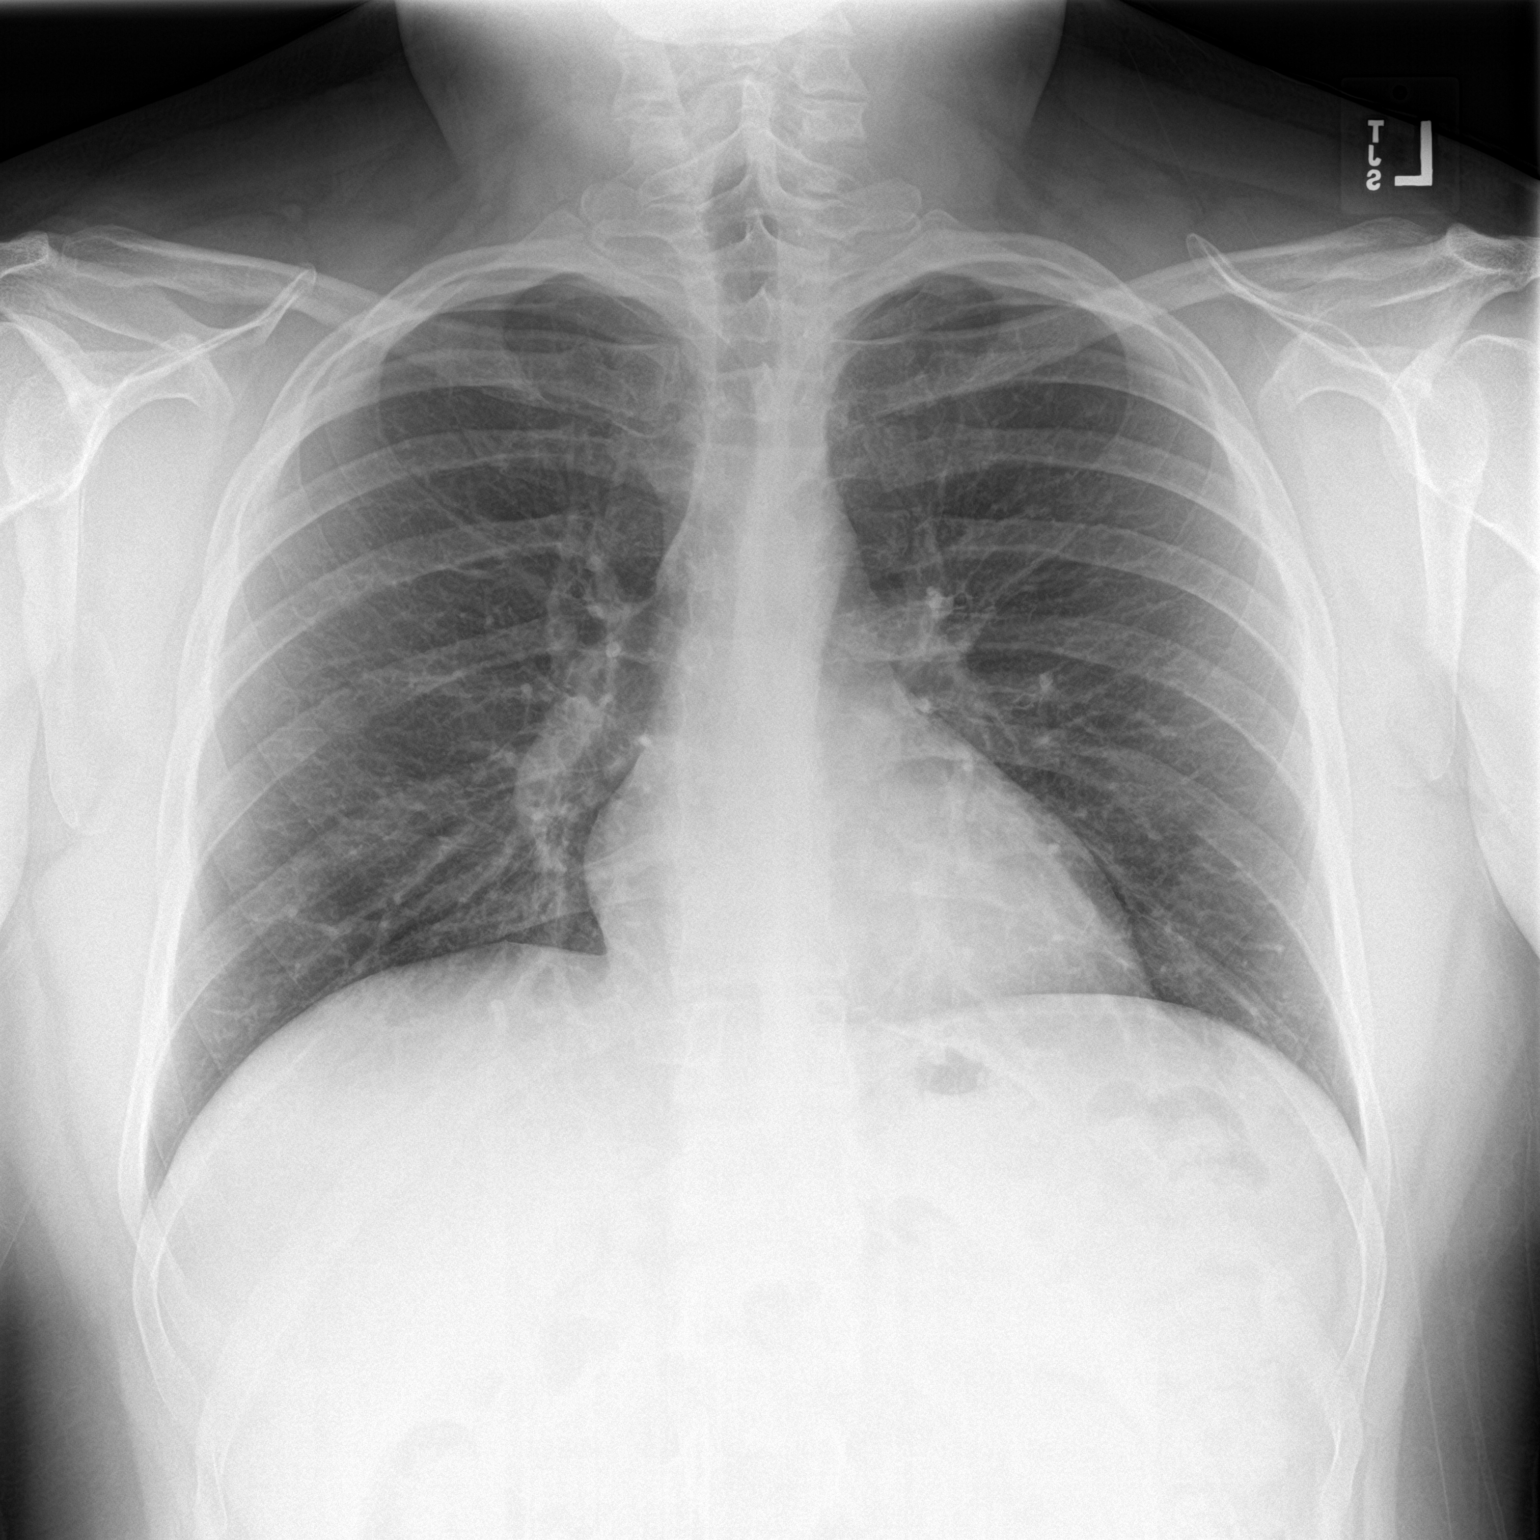
[im 2/2]
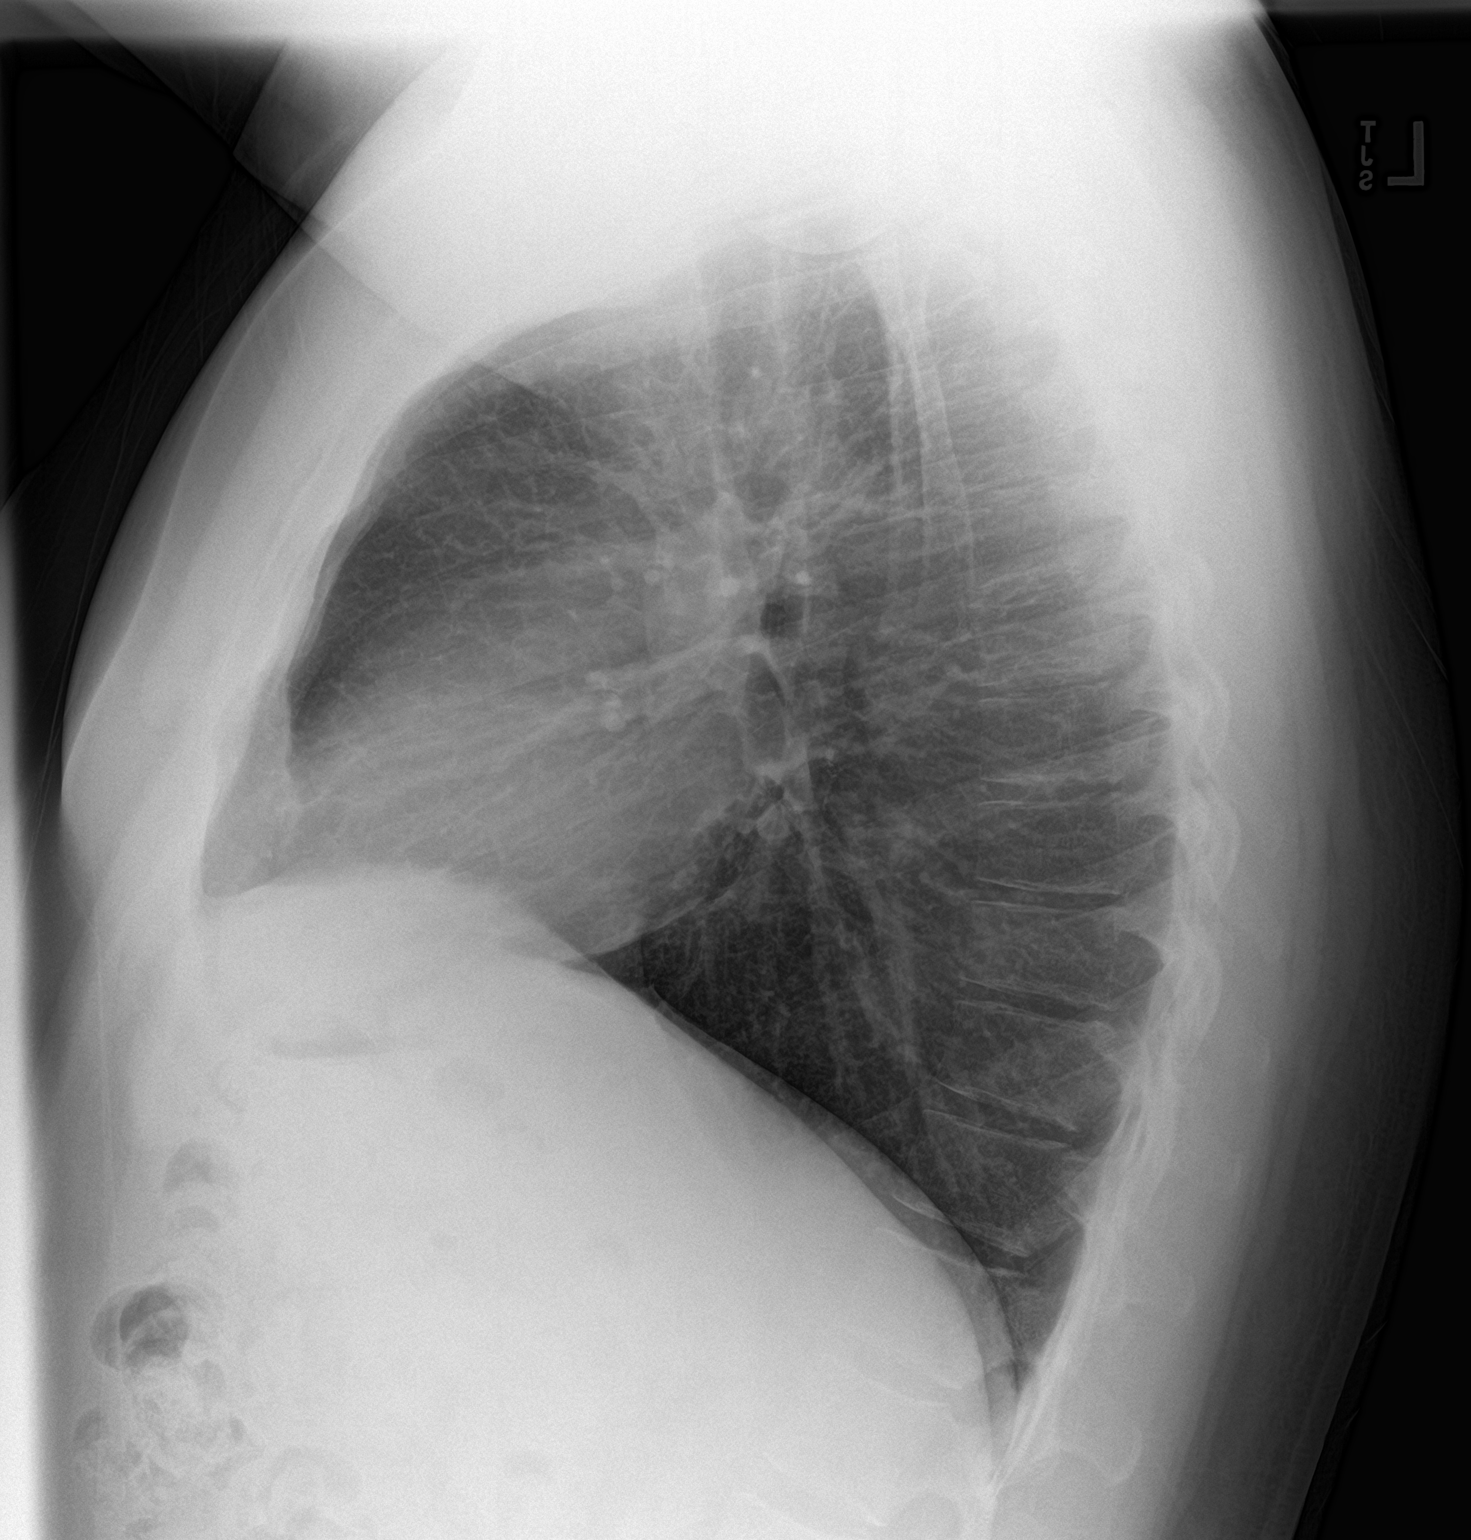

[2 of 2 positions shown; findings below may reference images not displayed]

FINDINGS: Lungs clear. Heart size normal. No pneumothorax or pleural fluid. No
acute or focal bony abnormality.
IMPRESSION: Negative chest.

## 2020-08-25 ENCOUNTER — Other Ambulatory Visit: Payer: Self-pay

## 2020-08-25 DIAGNOSIS — F419 Anxiety disorder, unspecified: Secondary | ICD-10-CM

## 2020-08-25 MED ORDER — ESCITALOPRAM OXALATE 20 MG PO TABS: ORAL_TABLET | ORAL | 1 refills | Status: AC

## 2020-08-25 MED ORDER — OMEPRAZOLE 20 MG PO CPDR: 20.0000 mg | DELAYED_RELEASE_CAPSULE | Freq: Every day | ORAL | 1 refills | Status: AC

## 2020-10-30 ENCOUNTER — Other Ambulatory Visit: Payer: Self-pay | Admitting: Internal Medicine

## 2020-12-22 ENCOUNTER — Encounter: Payer: BC Managed Care – PPO | Admitting: Adult Health

## 2021-03-01 ENCOUNTER — Other Ambulatory Visit: Payer: Self-pay

## 2021-03-01 ENCOUNTER — Telehealth: Payer: Self-pay

## 2021-03-01 DIAGNOSIS — F419 Anxiety disorder, unspecified: Secondary | ICD-10-CM

## 2021-03-01 NOTE — Telephone Encounter (Signed)
lmom pt need appt for refills
